# Patient Record
Sex: Female | Born: 1950 | Race: White
Health system: Southern US, Community
[De-identification: ages and names within clinical notes are randomized; demographics above are authoritative.]

## PROBLEM LIST (undated history)

## (undated) ENCOUNTER — Ambulatory Visit (INDEPENDENT_AMBULATORY_CARE_PROVIDER_SITE_OTHER): Admission: RE | Payer: Self-pay | Admitting: "Psychiatric/Mental Health

## (undated) DIAGNOSIS — I351 Nonrheumatic aortic (valve) insufficiency: Secondary | ICD-10-CM

## (undated) DIAGNOSIS — I10 Essential (primary) hypertension: Secondary | ICD-10-CM

## (undated) DIAGNOSIS — E785 Hyperlipidemia, unspecified: Secondary | ICD-10-CM

## (undated) DIAGNOSIS — F32A Depression, unspecified: Secondary | ICD-10-CM

## (undated) DIAGNOSIS — I253 Aneurysm of heart: Secondary | ICD-10-CM

## (undated) DIAGNOSIS — I071 Rheumatic tricuspid insufficiency: Secondary | ICD-10-CM

## (undated) DIAGNOSIS — M542 Cervicalgia: Secondary | ICD-10-CM

## (undated) DIAGNOSIS — F419 Anxiety disorder, unspecified: Secondary | ICD-10-CM

## (undated) DIAGNOSIS — S92909A Unspecified fracture of unspecified foot, initial encounter for closed fracture: Secondary | ICD-10-CM

## (undated) DIAGNOSIS — F40240 Claustrophobia: Secondary | ICD-10-CM

## (undated) DIAGNOSIS — Z789 Other specified health status: Secondary | ICD-10-CM

## (undated) DIAGNOSIS — D649 Anemia, unspecified: Secondary | ICD-10-CM

## (undated) DIAGNOSIS — G4730 Sleep apnea, unspecified: Secondary | ICD-10-CM

## (undated) DIAGNOSIS — K5900 Constipation, unspecified: Secondary | ICD-10-CM

## (undated) DIAGNOSIS — G43909 Migraine, unspecified, not intractable, without status migrainosus: Secondary | ICD-10-CM

## (undated) DIAGNOSIS — M545 Low back pain, unspecified: Secondary | ICD-10-CM

## (undated) DIAGNOSIS — M810 Age-related osteoporosis without current pathological fracture: Secondary | ICD-10-CM

## (undated) DIAGNOSIS — M818 Other osteoporosis without current pathological fracture: Secondary | ICD-10-CM

## (undated) DIAGNOSIS — R609 Edema, unspecified: Secondary | ICD-10-CM

## (undated) DIAGNOSIS — Z9289 Personal history of other medical treatment: Secondary | ICD-10-CM

## (undated) DIAGNOSIS — R002 Palpitations: Secondary | ICD-10-CM

## (undated) DIAGNOSIS — E039 Hypothyroidism, unspecified: Secondary | ICD-10-CM

## (undated) HISTORY — DX: Anxiety disorder, unspecified: F41.9

## (undated) HISTORY — DX: Rheumatic tricuspid insufficiency: I07.1

## (undated) HISTORY — DX: Constipation, unspecified: K59.00

## (undated) HISTORY — DX: Claustrophobia: F40.240

## (undated) HISTORY — DX: Depression, unspecified: F32.A

## (undated) HISTORY — DX: Other specified health status: Z78.9

## (undated) HISTORY — PX: WISDOM TOOTH EXTRACTION: SHX21

## (undated) HISTORY — DX: Unspecified fracture of unspecified foot, initial encounter for closed fracture: S92.909A

## (undated) HISTORY — DX: Essential (primary) hypertension: I10

## (undated) HISTORY — DX: Hyperlipidemia, unspecified: E78.5

## (undated) HISTORY — DX: Palpitations: R00.2

## (undated) HISTORY — DX: Edema, unspecified: R60.9

## (undated) HISTORY — DX: Migraine, unspecified, not intractable, without status migrainosus: G43.909

## (undated) HISTORY — DX: Anemia, unspecified: D64.9

## (undated) HISTORY — DX: Cervicalgia: M54.2

## (undated) HISTORY — DX: Low back pain, unspecified: M54.50

## (undated) HISTORY — DX: Personal history of other medical treatment: Z92.89

## (undated) HISTORY — PX: NECK SURGERY: SHX720

## (undated) HISTORY — PX: TONSILLECTOMY: SUR1361

## (undated) HISTORY — DX: Nonrheumatic aortic (valve) insufficiency: I35.1

## (undated) HISTORY — DX: Age-related osteoporosis without current pathological fracture: M81.0

## (undated) HISTORY — DX: Other osteoporosis without current pathological fracture: M81.8

## (undated) HISTORY — DX: Sleep apnea, unspecified: G47.30

## (undated) HISTORY — DX: Hypothyroidism, unspecified: E03.9

## (undated) HISTORY — DX: Aneurysm of heart: I25.3

---

## 1978-12-09 HISTORY — PX: D&C WITH HYSTEROSCOPY: SHX510231

## 1984-12-09 DIAGNOSIS — Z5189 Encounter for other specified aftercare: Secondary | ICD-10-CM

## 1984-12-09 HISTORY — DX: Encounter for other specified aftercare: Z51.89

## 1988-12-09 HISTORY — PX: HYSTERECTOMY: SHX81

## 2003-12-10 DIAGNOSIS — C4490 Unspecified malignant neoplasm of skin, unspecified: Secondary | ICD-10-CM

## 2003-12-10 HISTORY — PX: MOHS SURGERY: SUR867

## 2003-12-10 HISTORY — DX: Unspecified malignant neoplasm of skin, unspecified: C44.90

## 2006-07-14 ENCOUNTER — Ambulatory Visit: Admission: RE | Admit: 2006-07-14 | Payer: Self-pay | Source: Ambulatory Visit | Admitting: Gastroenterology

## 2006-10-22 ENCOUNTER — Emergency Department: Admit: 2006-10-22 | Payer: Self-pay | Source: Emergency Department | Admitting: Emergency Medical Services

## 2006-10-23 LAB — CBC WITH AUTO DIFFERENTIAL CERNER
Basophils Absolute: 0 /mm3 (ref 0.0–0.2)
Basophils: 1 % (ref 0–2)
Eosinophils Absolute: 0.3 /mm3 (ref 0.0–0.7)
Eosinophils: 6 % — ABNORMAL HIGH (ref 0–5)
Granulocytes Absolute: 2.5 /mm3 (ref 1.8–8.1)
Hematocrit: 35.2 % — ABNORMAL LOW (ref 37.0–47.0)
Hgb: 11.9 G/DL — ABNORMAL LOW (ref 12.0–16.0)
Lymphocytes Absolute: 1.8 /mm3 (ref 0.5–4.4)
Lymphocytes: 36 % (ref 15–41)
MCH: 30.3 PG (ref 28.0–32.0)
MCHC: 33.9 G/DL (ref 32.0–36.0)
MCV: 89.4 FL (ref 80.0–100.0)
MPV: 7.1 FL — ABNORMAL LOW (ref 7.4–10.4)
Monocytes Absolute: 0.4 /mm3 (ref 0.0–1.2)
Monocytes: 8 % (ref 0–11)
Neutrophils %: 50 % — ABNORMAL LOW (ref 52–75)
Platelets: 289 /mm3 (ref 140–400)
RBC: 3.94 /mm3 — ABNORMAL LOW (ref 4.20–5.40)
RDW: 12.3 % (ref 11.5–15.0)
WBC: 4.9 /mm3 (ref 3.5–10.8)

## 2006-10-23 LAB — BASIC METABOLIC PANEL
BUN: 18 MG/DL (ref 7–21)
CO2: 31 MEQ/L (ref 22–31)
Calcium: 11.5 MG/DL — ABNORMAL HIGH (ref 8.6–10.2)
Chloride: 101 MEQ/L (ref 98–107)
Creatinine: 0.9 MG/DL (ref 0.5–1.4)
Glucose: 90 MG/DL (ref 65–110)
Potassium: 3.8 MEQ/L (ref 3.6–5.0)
Sodium: 141 MEQ/L (ref 136–143)

## 2011-09-25 LAB — ECG 12-LEAD
Atrial Rate: 72 {beats}/min
P Axis: 73 degrees
P-R Interval: 154 ms
Q-T Interval: 394 ms
QRS Duration: 100 ms
QTC Calculation (Bezet): 431 ms
R Axis: 75 degrees
T Axis: 55 degrees
Ventricular Rate: 72 {beats}/min

## 2011-11-26 NOTE — Op Note (Signed)
MRN: [id number] DOCUMENT ID: [id number]      INTRODUCTION:      60 YEAR OLD FEMALE PATIENT PRESENTS FOR AN ELECTIVE OUTPATIENT      COLONOSCOPY.  THE INDICATION FOR THE PROCEDURE WAS AVERAGE RISK SCREENING      FOR COLONIC POLYPS.      CLINICAL HISTORY  PHYSICAL EXAMINATION:      THE PATIENT'S CLINICAL HISTORY AND PHYSICAL EXAMINATION WERE PERFORMED AND      ARE DOCUMENTED IN THE PATIENT'S RECORD.      CONSENT:      THE BENEFITS, RISKS, AND ALTERNATIVES TO THE PROCEDURE WERE DISCUSSED AND      INFORMED CONSENT WAS OBTAINED FROM THE PATIENT.      PREPARATION:      EKG, PULSE, PULSE OXIMETRY AND BLOOD PRESSURE MONITORED.      MEDICATIONS:      - MAC ANESTHESIA WAS ADMINISTERED      PROCEDURE:      RECTAL EXAM: NORMAL.      THE ENDOSCOPE WAS PASSED WITHOUT DIFFICULTY UNDER DIRECT VISUALIZATION TO      THE CECUM CONFIRMED BY LANDMARKS AND PHOTOGRAPH NUMBER 1 AND 2.      RETROFLEXION WAS PERFORMED IN THE RECTUM.  THE QUALITY OF THE PREPARATION      WAS GOOD.      FINDINGS:  THE COLONIC MUCOSA APPEARED ENTIRELY NORMAL.  THERE WERE NO      MASSES OR POLYPS FOUND.  THERE WERE NO VASCULAR ABNORMALITIES NOTED.      COMPLICATIONS:      THERE WERE NO COMPLICATIONS ASSOCIATED WITH THE PROCEDURE.      IMPRESSION:      1.  NORMAL COLONOSCOPIC EXAMINATION [45.23*].      RECOMMENDATION:      - REPEAT COLONOSCOPY IN 10 YEARS.      SIGNING PHYSICIAN: Jeanene Mena

## 2012-06-12 ENCOUNTER — Other Ambulatory Visit: Payer: Self-pay | Admitting: Family Medicine

## 2012-06-12 DIAGNOSIS — R51 Headache: Secondary | ICD-10-CM

## 2012-06-14 ENCOUNTER — Ambulatory Visit: Payer: Exclusive Provider Organization

## 2012-06-16 ENCOUNTER — Ambulatory Visit: Payer: Exclusive Provider Organization

## 2013-06-08 DIAGNOSIS — N181 Chronic kidney disease, stage 1: Secondary | ICD-10-CM

## 2013-06-08 HISTORY — DX: Chronic kidney disease, stage 1: N18.1

## 2013-07-14 ENCOUNTER — Ambulatory Visit (INDEPENDENT_AMBULATORY_CARE_PROVIDER_SITE_OTHER): Payer: Exclusive Provider Organization | Admitting: Internal Medicine

## 2013-07-14 ENCOUNTER — Encounter (INDEPENDENT_AMBULATORY_CARE_PROVIDER_SITE_OTHER): Payer: Self-pay

## 2013-07-14 ENCOUNTER — Encounter (INDEPENDENT_AMBULATORY_CARE_PROVIDER_SITE_OTHER): Payer: Self-pay | Admitting: Internal Medicine

## 2013-07-14 VITALS — BP 138/79 | HR 85 | Temp 97.9°F | Resp 12 | Ht 63.0 in | Wt 117.0 lb

## 2013-07-14 NOTE — Progress Notes (Addendum)
Subjective:       Patient ID: Katherine Mays is a 62 y.o. female.  Chief Complaint   Patient presents with   . Osteoporosis   . Chronic Kidney Disease   . Depression   . Anemia     c  Anemia  Presents for initial visit. Symptoms include abdominal pain, light-headedness and malaise/fatigue. There has been no fever or weight loss. Signs of blood loss that are not present include hematemesis, hematochezia, melena and vaginal bleeding. Past treatments include oral iron supplements (started recently.). There is no history of chronic liver disease.   Depression: Patient complains of depression. She complains of depressed mood, fatigue, hopelessness and insomnia. Onset was approximately several years ago, stable since that time.  She denies current suicidal and homicidal plan or intent.   Family history significant for no psychiatric illness.Possible organic causes contributing are: none.  Risk factors: none Previous treatment includes BuSpar, Effexor and seoquel, ambien and individual therapy. She complains of the following side effects from the treatment: none.  Osteoporosis: she has OP for several years. She had hysterectomy at young age. Mild bone pain.   CKD: she was noted to have borderline elevated creatinine for 3 months. She needs evaluation.     Past Medical History   Diagnosis Date   . Anemia    . Osteoporosis, idiopathic    . Depression age 26      followed by Psychiatrist.    . Disorder of thyroid      followed by Endocrinologist.        History     Social History   . Marital Status: Single     Spouse Name: N/A     Number of Children: N/A   . Years of Education: N/A     Occupational History   . Not on file.     Social History Main Topics   . Smoking status: Never Smoker    . Smokeless tobacco: Never Used   . Alcohol Use: Yes      Comment: 5-6 drinks per week   . Drug Use: No   . Sexually Active:      Other Topics Concern   . Not on file     Social History Narrative   . No narrative on file       Current  Outpatient Prescriptions   Medication Sig Dispense Refill   . aspirin 325 MG tablet Take 325 mg by mouth daily.       . busPIRone (BUSPAR) 30 MG tablet Take 30 mg by mouth.       . Calcium Carbonate-Vitamin D (CALCIUM + D PO) Take by mouth.       . Cholecalciferol (VITAMIN D3 PO) Take by mouth.       . Cyanocobalamin (VITAMIN B-12 PO) Take by mouth.       . HydrOXYzine HCl (ATARAX PO) Take by mouth.       . lactulose (CHRONULAC) 10 GM/15ML solution Take 20 g by mouth.       . LamoTRIgine 200 MG Tablet SR 24 hr Take by mouth.       . levothyroxine (SYNTHROID) 75 MCG tablet Take 75 mcg by mouth.       . liothyronine (CYTOMEL) 5 MCG tablet Take 5 mcg by mouth.       . prazosin (MINIPRESS) 1 MG capsule Take 1 mg by mouth.       . QUEtiapine (SEROQUEL) 50 MG tablet Take 50 mg by mouth  3 (three) times daily.       . QUEtiapine Fumarate (SEROQUEL XR) 150 MG Tablet SR 24 hr Take 150 mg by mouth nightly.       . venlafaxine (EFFEXOR-XR) 150 MG 24 hr capsule Take 150 mg by mouth.       . zolpidem (AMBIEN CR) 6.25 MG CR tablet Take 6.25 mg by mouth.           Allergies   Allergen Reactions   . Keflex (Cephalexin)        Past Surgical History   Procedure Date   . Tonsillectomy    . Cesarean section      [age]   . Neck surgery    . Wisdom tooth extraction    . Hysterectomy 1995       Family History   Problem Relation Age of Onset   . Thyroid disease Mother    . Diabetes Father    . Heart disease Father 27     M I    . Osteoporosis Father    . Stroke Father            Review of Systems   Constitutional: Positive for malaise/fatigue and fatigue. Negative for fever, chills, weight loss and diaphoresis.   Gastrointestinal: Positive for abdominal pain. Negative for melena, hematochezia and hematemesis.   Genitourinary: Negative for vaginal bleeding.   Musculoskeletal: Positive for myalgias, back pain and arthralgias.   Skin: Negative.    Neurological: Positive for light-headedness.   Psychiatric/Behavioral: Positive for sleep  disturbance and decreased concentration. Negative for behavioral problems and agitation. The patient is nervous/anxious.    All other systems reviewed and are negative.            Objective:    Physical Exam   Vitals reviewed.  Constitutional: She is oriented to person, place, and time. She appears well-developed and well-nourished.   HENT:   Right Ear: External ear normal.   Left Ear: External ear normal.   Nose: Nose normal.   Mouth/Throat: Oropharynx is clear and moist.   Eyes: Conjunctivae normal are normal. Pupils are equal, round, and reactive to light.   Neck: Normal range of motion. Neck supple. No JVD present. No tracheal deviation present. No thyromegaly present.   Cardiovascular: Normal rate, regular rhythm, normal heart sounds and intact distal pulses.    Pulmonary/Chest: Effort normal and breath sounds normal. She has no decreased breath sounds. She has no wheezes. She has no rhonchi. She has no rales.   Abdominal: Soft. Bowel sounds are normal. There is no hepatosplenomegaly. There is tenderness in the left upper quadrant. There is no rebound and no CVA tenderness. No hernia.   Lymphadenopathy:     She has no cervical adenopathy.   Neurological: She is alert and oriented to person, place, and time. She has normal reflexes. No cranial nerve deficit. Coordination normal.   Skin: Skin is warm and dry. No rash noted. She is not diaphoretic. No erythema.   Psychiatric: Her speech is normal and behavior is normal. Judgment and thought content normal. Her affect is not angry. Cognition and memory are normal. She exhibits a depressed mood.     Filed Vitals:    07/14/13 1503   BP: 138/79   Pulse: 85   Temp: 97.9 F (36.6 C)   TempSrc: Tympanic   Resp: 12   Height: 1.6 m (5\' 3" )   Weight: 53.071 kg (117 lb)   SpO2: 98%  Assessment:       1. Iron deficiency anemia, unspecified  CBC and differential   2. CKD (chronic kidney disease), unspecified stage  Renal function panel    Urinalysis    Ultrasound  renal kidney   3. Senile osteoporosis     4. Depression             Plan:       1. She was advised to take OTC Iron pill 325 mg daily. CBC is ordered.   2. She was ordered to have U/s kidneys and renal panel. She will reduce Aspirin to 81 mg daily. Avoid other NSAIDS.  3. She will take Calcium and Vit D. Exercise more.   4. She sees Psychiatrist for depression treatment. Currently well controled on multiple medicines.   5. Seeing Endo for thyroid problems.   6. Risk & Benefits of the new medication(s) were explained to the patient (and family) who verbalized understanding & agreed to the treatment plan. Patient (family) encouraged to contact me/clinical staff with any questions/concerns

## 2013-07-15 LAB — URINALYSIS
Bilirubin, UA: NEGATIVE
Blood, UA: NEGATIVE
Glucose Qualitative: NEGATIVE
Ketones UA: NEGATIVE
Leukocyte Esterase, UA: NEGATIVE
NITRITE: NEGATIVE
Protein, UA: NEGATIVE
Specific Gravity, UA: 1.007 (ref 1.001–1.035)
pH: 7 (ref 5.0–8.0)

## 2013-07-15 LAB — CBC AND DIFFERENTIAL
Atypical Lymphocytes %: 0 %
Baso(Absolute): 32 cells/uL (ref 0–200)
Basophils: 0.6 %
Eosinophils Absolute: 173 cells/uL (ref 15–500)
Eosinophils: 3.2 %
Hematocrit: 34 % — ABNORMAL LOW (ref 35.0–45.0)
Hemoglobin: 11.2 g/dL — ABNORMAL LOW (ref 11.7–15.5)
Lymphocytes Absolute: 1199 cells/uL (ref 850–3900)
Lymphocytes: 22.2 %
MCH: 28.4 pg (ref 27–33)
MCHC: 33 g/dL (ref 32–36)
MCV: 86 fL (ref 80–100)
MPV: 7.2 fL — ABNORMAL LOW (ref 7.5–11.5)
Monocytes Absolute: 448 cells/uL (ref 200–950)
Monocytes: 8.3 %
Neutrophils Absolute: 3548 cells/uL (ref 1500–7800)
Neutrophils: 65.7 %
Platelets: 306 10*3/uL (ref 140–400)
RBC: 3.96 10*6/uL (ref 3.80–5.10)
RDW: 19.3 % — ABNORMAL HIGH (ref 11.0–15.0)
WBC: 5.4 10*3/uL (ref 3.8–10.8)

## 2013-07-15 LAB — RENAL FUNCTION PANEL
Albumin: 4.3 G/DL (ref 3.6–5.1)
BUN / Creatinine Ratio: 17.4 (ref 6–22)
BUN: 21 MG/DL (ref 7–25)
CO2: 30 mmol/L (ref 19–30)
Calcium: 9.5 MG/DL (ref 8.6–10.4)
Chloride: 101 mmol/L (ref 98–110)
Creatinine: 1.21 mg/dL — ABNORMAL HIGH (ref 0.50–0.99)
EGFR African American: 55 mL/min/{1.73_m2} — AB (ref >=60)
EGFR: 48 mL/min/{1.73_m2} — AB (ref >=60)
Glucose: 89 MG/DL (ref 65–99)
Phosphorus: 5.3 MG/DL — ABNORMAL HIGH (ref 2.5–4.5)
Potassium: 4.1 mmol/L (ref 3.5–5.3)
Sodium: 139 mmol/L (ref 135–146)

## 2013-07-16 ENCOUNTER — Ambulatory Visit
Admission: RE | Admit: 2013-07-16 | Discharge: 2013-07-16 | Disposition: A | Discharge status: Home / Self Care | Payer: Exclusive Provider Organization | Source: Ambulatory Visit | Attending: Internal Medicine | Admitting: Internal Medicine

## 2013-07-16 DIAGNOSIS — N189 Chronic kidney disease, unspecified: Secondary | ICD-10-CM | POA: Insufficient documentation

## 2013-07-19 ENCOUNTER — Telehealth (INDEPENDENT_AMBULATORY_CARE_PROVIDER_SITE_OTHER): Payer: Self-pay | Admitting: Internal Medicine

## 2013-07-19 ENCOUNTER — Encounter (INDEPENDENT_AMBULATORY_CARE_PROVIDER_SITE_OTHER): Payer: Self-pay | Admitting: Internal Medicine

## 2013-07-19 ENCOUNTER — Ambulatory Visit (INDEPENDENT_AMBULATORY_CARE_PROVIDER_SITE_OTHER): Payer: Exclusive Provider Organization | Admitting: Internal Medicine

## 2013-07-19 VITALS — BP 139/80 | HR 88 | Temp 97.1°F | Resp 12 | Ht 63.0 in | Wt 117.0 lb

## 2013-07-19 NOTE — Telephone Encounter (Signed)
Pt was told by radiology that her kidneys - fair [facility] hospital radiology dept   Pt called to get all her results from [place] = labs and radiology   Pt currently has an

## 2013-07-19 NOTE — Progress Notes (Signed)
Subjective:       Patient ID: Katherine Mays is a 62 y.o. female.  Chief Complaint   Patient presents with   . Anemia   . Chronic Kidney Disease       Anemia  Presents for initial visit. Symptoms include malaise/fatigue and paresthesias. There has been no fever or weight loss. Signs of blood loss that are not present include hematemesis, hematochezia, melena and vaginal bleeding. Past treatments include nothing. Past medical history includes chronic renal disease.   CKD stage I: no symptoms. Was on Aspirin 325 mg daily.     Chemistry        Component Value Date/Time    NA 139 07/14/2013 1558    K 4.1 07/14/2013 1558    CL 101 07/14/2013 1558    CL 101 10/23/2006 0130    CO2 30 07/14/2013 1558    BUN 21 07/14/2013 1558    GLU 89 07/14/2013 1558    No results found for this basename: CALCIUM, ALKPHOS, AST, ALT, BILITOT            The following portions of the patient's history were reviewed and updated as appropriate: allergies, current medications, past family history, past medical history, past social history, past surgical history and problem list.    Review of Systems   Constitutional: Positive for malaise/fatigue and fatigue. Negative for fever, weight loss and unexpected weight change.   Gastrointestinal: Negative for melena, hematochezia and hematemesis.   Genitourinary: Negative for urgency, decreased urine volume, vaginal bleeding and difficulty urinating.   Neurological: Positive for paresthesias.           Objective:    Physical Exam   Vitals reviewed.  Constitutional: She appears well-developed and well-nourished.   Cardiovascular: Normal rate, regular rhythm and normal heart sounds.    Pulmonary/Chest: Effort normal and breath sounds normal. No respiratory distress. She has no wheezes.   Abdominal: Soft. Bowel sounds are normal. She exhibits no distension and no mass. There is no tenderness.   Musculoskeletal: Normal range of motion. She exhibits no edema and no tenderness.   Skin: Skin is warm and dry. No rash noted.  No erythema.     Filed Vitals:    07/19/13 1410   BP: 139/80   Pulse: 88   Temp: 97.1 F (36.2 C)   TempSrc: Tympanic   Resp: 12   Height: 1.6 m (5\' 3" )   Weight: 53.071 kg (117 lb)   SpO2: 98%             Assessment:       1. CKD (chronic kidney disease), stage I  Ambulatory referral to Nephrology   2. Anemia  Ambulatory referral to Nephrology    CBC and differential    IRON PROFILE    Ferritin    Vitamin B12    Folate           Plan:       1. Referred to Nephrologist. She will avoid NSAIDS. She will ask Psychiatrist to taper off Minipress.   2. Anemia work up is ordered. Thyroid tests in 07/14 were normal.   3. Follow up in 3 months.

## 2013-07-20 LAB — CBC AND DIFFERENTIAL
Atypical Lymphocytes %: 0 %
Baso(Absolute): 31 cells/uL (ref 0–200)
Basophils: 0.5 %
Eosinophils Absolute: 171 cells/uL (ref 15–500)
Eosinophils: 2.8 %
Hematocrit: 36.4 % (ref 35.0–45.0)
Hemoglobin: 11.9 g/dL (ref 11.7–15.5)
Lymphocytes Absolute: 1458 cells/uL (ref 850–3900)
Lymphocytes: 23.9 %
MCH: 28.3 pg (ref 27–33)
MCHC: 32.7 g/dL (ref 32–36)
MCV: 87 fL (ref 80–100)
MPV: 7.7 fL (ref 7.5–11.5)
Monocytes Absolute: 616 cells/uL (ref 200–950)
Monocytes: 10.1 %
Neutrophils Absolute: 3825 cells/uL (ref 1500–7800)
Neutrophils: 62.7 %
Platelets: 284 10*3/uL (ref 140–400)
RBC: 4.21 10*6/uL (ref 3.80–5.10)
RDW: 18.6 % — ABNORMAL HIGH (ref 11.0–15.0)
WBC: 6.1 10*3/uL (ref 3.8–10.8)

## 2013-07-20 LAB — IRON PROFILE
Iron Saturation: 11 % — ABNORMAL LOW (ref 15–50)
Iron: 42 ug/dL (ref 40–160)
TIBC: 397 ug/dl (ref 250–450)

## 2013-07-20 LAB — VITAMIN B12: Vitamin B-12: 463 pg/mL (ref 200–1100)

## 2013-07-20 LAB — FERRITIN: Ferritin: 20 ng/mL (ref 20–288)

## 2013-07-20 LAB — FOLATE: Folate: >24 ng/mL (ref >5.4)

## 2013-08-16 ENCOUNTER — Ambulatory Visit (INDEPENDENT_AMBULATORY_CARE_PROVIDER_SITE_OTHER): Payer: Exclusive Provider Organization | Admitting: Internal Medicine

## 2013-08-17 ENCOUNTER — Ambulatory Visit (INDEPENDENT_AMBULATORY_CARE_PROVIDER_SITE_OTHER): Payer: Exclusive Provider Organization | Admitting: Internal Medicine

## 2013-11-19 ENCOUNTER — Encounter (INDEPENDENT_AMBULATORY_CARE_PROVIDER_SITE_OTHER): Payer: Self-pay | Admitting: Internal Medicine

## 2013-11-19 ENCOUNTER — Ambulatory Visit (INDEPENDENT_AMBULATORY_CARE_PROVIDER_SITE_OTHER): Payer: Commercial Managed Care - POS | Admitting: Internal Medicine

## 2013-11-19 VITALS — BP 132/82 | HR 86 | Temp 97.5°F | Resp 12 | Ht 63.0 in | Wt 117.0 lb

## 2013-11-19 DIAGNOSIS — E039 Hypothyroidism, unspecified: Secondary | ICD-10-CM

## 2013-11-19 DIAGNOSIS — I10 Essential (primary) hypertension: Secondary | ICD-10-CM

## 2013-11-19 DIAGNOSIS — N189 Chronic kidney disease, unspecified: Secondary | ICD-10-CM

## 2013-11-19 MED ORDER — DILTIAZEM HCL ER COATED BEADS 120 MG PO CP24
120.0000 mg | ORAL_CAPSULE | Freq: Every day | ORAL | Status: DC
Start: 2013-11-19 — End: 2013-12-10

## 2013-11-19 NOTE — Patient Instructions (Signed)
Patient Education   Diltiazem Hydrochloride Oral capsule, extended-release    Diltiazem Hydrochloride Oral tablet    Diltiazem Hydrochloride Oral tablet, extended-release    Diltiazem Hydrochloride Solution for injection    Diltiazem Maleate Oral tablet, extended-release   Diltiazem Hydrochloride Oral tablet  What is this medicine?  DILTIAZEM (dil TYE a zem) is a calcium-channel blocker. It affects the amount of calcium found in your heart and muscle cells. This relaxes your blood vessels, which can reduce the amount of work the heart has to do. This medicine is used to treat chest pain caused by angina.  This medicine may be used for other purposes; ask your health care provider or pharmacist if you have questions.  What should I tell my health care provider before I take this medicine?  They need to know if you have any of these conditions:   heart problems, low blood pressure, irregular heartbeat   liver disease   previous heart attack   an unusual or allergic reaction to diltiazem, other medicines, foods, dyes, or preservatives   pregnant or trying to get pregnant   breast-feeding  How should I use this medicine?  Take this medicine by mouth with a glass of water. Follow the directions on the prescription label. This medicine is usually taken before meals and at bedtime. Take your doses at regular intervals. Do not take your medicine more often then directed. Do not stop taking except on the advice of your doctor or health care professional.  Talk to your pediatrician regarding the use of this medicine in children. Special care may be needed.  Overdosage: If you think you have taken too much of this medicine contact a poison control center or emergency room at once.  NOTE: This medicine is only for you. Do not share this medicine with others.  What if I miss a dose?  If you miss a dose, take it as soon as you can. If it is almost time for your next dose, take only that dose. Do not take double or  extra doses.  What may interact with this medicine?  Do not take this medicine with any of the following:   cisapride   hawthorn   pimozide   ranolazine   red yeast rice  This medicine may also interact with the following medications:   buspirone   carbamazepine   cimetidine   cyclosporine   digoxin   local anesthetics or general anesthetics   lovastatin   medicines for anxiety or difficulty sleeping like midazolam and triazolam   medicines for high blood pressure or heart problems   quinidine   rifampin, rifabutin, or rifapentine  This list may not describe all possible interactions. Give your health care provider a list of all the medicines, herbs, non-prescription drugs, or dietary supplements you use. Also tell them if you smoke, drink alcohol, or use illegal drugs. Some items may interact with your medicine.  What should I watch for while using this medicine?  Check your blood pressure and pulse rate regularly. Ask your doctor or health care professional what your blood pressure and pulse rate should be and when you should contact him or her.  You may feel dizzy or lightheaded. Do not drive, use machinery, or do anything that needs mental alertness until you know how this medicine affects you. To reduce the risk of dizzy or fainting spells, do not sit or stand up quickly, especially if you are an older patient. Alcohol can make you more   dizzy or increase flushing and rapid heartbeats. Avoid alcoholic drinks.  What side effects may I notice from receiving this medicine?  Side effects that you should report to your doctor or health care professional as soon as possible:   allergic reactions like skin rash, itching or hives, swelling of the face, lips, or tongue   confusion, mental depression   feeling faint or lightheaded, falls   pinpoint red spots on the skin   redness, blistering, peeling or loosening of the skin, including inside the mouth   slow, irregular heartbeat   swelling of the  ankles, feet   unusual bleeding or bruising  Side effects that usually do not require medical attention (report to your doctor or health care professional if they continue or are bothersome):   change in sex drive or performance   constipation or diarrhea   flushing of the face   headache   nausea, vomiting   tired or weak   trouble sleeping  This list may not describe all possible side effects. Call your doctor for medical advice about side effects. You may report side effects to FDA at 1-800-FDA-1088.  Where should I keep my medicine?  Keep out of the reach of children.  Store at room temperature between 20 and 25 degrees C (68 and 77 degrees F). Protect from light. Keep container tightly closed. Throw away any unused medicine after the expiration date.  NOTE:This sheet is a summary. It may not cover all possible information. If you have questions about this medicine, talk to your doctor, pharmacist, or health care provider. Copyright 2014 [employer]

## 2013-11-19 NOTE — Progress Notes (Signed)
Subjective:       Patient ID: Katherine Mays is a 62 y.o. female.  Chief Complaint   Patient presents with   . Hypertension   . Chronic Kidney Disease   . Hypothyroidism       Hypertension  This is a new problem. The current episode started 1 to 4 weeks ago. The problem has been waxing and waning since onset. The problem is controlled. Associated symptoms include anxiety. Pertinent negatives include no chest pain, headaches, palpitations, PND or shortness of breath. There are no associated agents to hypertension. Risk factors for coronary artery disease include post-menopausal state. Past treatments include nothing. There is no history of CAD/MI or CVA. Identifiable causes of hypertension include chronic renal disease.   Hypothyroidism  Presents for follow-up visit. Symptoms include anxiety, cold intolerance and fatigue. Patient reports no palpitations, tremors, visual change, weight gain or weight loss. The symptoms have been stable.   CKD: she saw nephrologist and had several blood tests. I reviewed results. Last Creatinine is 1.07. She has stopped Minipress. She monitored her BP which has gradually increased to SBP 140-150 and DBP 85.   Current Outpatient Prescriptions   Medication Sig Dispense Refill   . aspirin EC 81 MG EC tablet Take 81 mg by mouth daily.       . iron-vitamin C (VITRON-C) 65-125 MG Tab Take by mouth.       . busPIRone (BUSPAR) 30 MG tablet Take 30 mg by mouth.       . Calcium Carbonate-Vitamin D (CALCIUM + D PO) Take by mouth.       . Cholecalciferol (VITAMIN D3 PO) Take by mouth.       . Cyanocobalamin (VITAMIN B-12 PO) Take by mouth.       . diltiazem (CARDIZEM CD) 120 MG 24 hr capsule Take 1 capsule (120 mg total) by mouth daily.  30 capsule  0   . HydrOXYzine HCl (ATARAX PO) Take by mouth.       . lactulose (CHRONULAC) 10 GM/15ML solution Take 20 g by mouth.       . Lactulose (DUPHALAC) 10 GM/15ML Solution        . LamoTRIgine 200 MG Tablet SR 24 hr Take by mouth.       . levothyroxine  (SYNTHROID) 75 MCG tablet Take 75 mcg by mouth.       . liothyronine (CYTOMEL) 5 MCG tablet Take 5 mcg by mouth.       . prazosin (MINIPRESS) 1 MG capsule Take 1 mg by mouth.       . QUEtiapine (SEROQUEL) 50 MG tablet Take 50 mg by mouth 3 (three) times daily.       . QUEtiapine Fumarate (SEROQUEL XR) 150 MG Tablet SR 24 hr Take 150 mg by mouth nightly.       . valacyclovir (VALTREX) 1000 MG tablet        . venlafaxine (EFFEXOR-XR) 150 MG 24 hr capsule Take 150 mg by mouth.       . zolpidem (AMBIEN CR) 6.25 MG CR tablet Take 6.25 mg by mouth.       . [DISCONTINUED] aspirin 325 MG tablet Take 325 mg by mouth daily.             The following portions of the patient's history were reviewed and updated as appropriate: allergies, current medications, past family history, past medical history, past social history, past surgical history and problem list.    Review of Systems  Constitutional: Positive for fatigue. Negative for fever, weight loss, weight gain and unexpected weight change.   Respiratory: Negative for cough, shortness of breath and wheezing.    Cardiovascular: Negative for chest pain, palpitations and PND.   Gastrointestinal: Negative for abdominal pain.   Genitourinary: Negative for urgency, decreased urine volume and difficulty urinating.   Musculoskeletal: Negative for arthralgias.   Neurological: Negative for tremors and headaches.   Hematological: Positive for cold intolerance.           Objective:    Physical Exam   Vitals reviewed.  Constitutional: She is oriented to person, place, and time. She appears well-developed and well-nourished.   HENT:   Nose: Nose normal.   Mouth/Throat: Oropharynx is clear and moist.   Eyes: Conjunctivae normal and EOM are normal. Pupils are equal, round, and reactive to light.   Neck: Normal range of motion. Neck supple. No thyromegaly present.   Cardiovascular: Normal rate, regular rhythm, normal heart sounds and intact distal pulses.    Pulmonary/Chest: Effort normal and  breath sounds normal. No respiratory distress. She has no wheezes. She has no rales.   Abdominal: Soft. Bowel sounds are normal. She exhibits no distension. There is no tenderness. There is no rebound.   Musculoskeletal: Normal range of motion. She exhibits no edema and no tenderness.   Neurological: She is alert and oriented to person, place, and time. No cranial nerve deficit. Coordination normal.     Filed Vitals:    11/19/13 1513 11/19/13 1556   BP: 132/85 132/82   Pulse: 86    Temp: 97.5 F (36.4 C)    TempSrc: Oral    Resp: 12    Height: 1.6 m (5\' 3" )    Weight: 53.071 kg (117 lb)    SpO2: 98%            Assessment:       1. Essential hypertension, benign    2. Hypothyroidism    3. CKD (chronic kidney disease), unspecified stage            Plan:       1. Her BP is borderline high. She may benefit from aggressive BP management due to CKD. I started her on Cardizem CD 120 mg daily. Side effects are explained.The risks and complications of high blood pressure have been explained. Importance of low salt diet, and weight control have been explained.    2. She will continue Synthroid 75 mcg and Cytomel 5 mg daily.   3. She is followed by Nephrologist. She needs to keep well hydrated. Oral fluids are advised.she will have more labs in Jan, 2015. Stop aspirin and avoid NSAIDS.   4. Risk & Benefits of the new medication(s) were explained to the patient (and family) who verbalized understanding & agreed to the treatment plan. Patient (family) encouraged to contact me/clinical staff with any questions/concerns

## 2013-11-22 ENCOUNTER — Encounter (INDEPENDENT_AMBULATORY_CARE_PROVIDER_SITE_OTHER): Payer: Self-pay

## 2013-12-10 ENCOUNTER — Ambulatory Visit (INDEPENDENT_AMBULATORY_CARE_PROVIDER_SITE_OTHER): Payer: Commercial Managed Care - POS | Admitting: Internal Medicine

## 2013-12-10 ENCOUNTER — Encounter (INDEPENDENT_AMBULATORY_CARE_PROVIDER_SITE_OTHER): Payer: Self-pay | Admitting: Internal Medicine

## 2013-12-10 VITALS — BP 110/72 | HR 88 | Temp 98.0°F | Resp 12 | Ht 63.0 in | Wt 116.0 lb

## 2013-12-10 DIAGNOSIS — N189 Chronic kidney disease, unspecified: Secondary | ICD-10-CM

## 2013-12-10 DIAGNOSIS — I10 Essential (primary) hypertension: Secondary | ICD-10-CM | POA: Insufficient documentation

## 2013-12-10 MED ORDER — DILTIAZEM HCL ER COATED BEADS 120 MG PO CP24
120.0000 mg | ORAL_CAPSULE | Freq: Every day | ORAL | Status: DC
Start: 2013-12-10 — End: 2013-12-10

## 2013-12-10 MED ORDER — DILTIAZEM HCL ER COATED BEADS 120 MG PO CP24
120.0000 mg | ORAL_CAPSULE | Freq: Every day | ORAL | Status: DC
Start: 2013-12-10 — End: 2014-11-11

## 2013-12-10 NOTE — Progress Notes (Signed)
Subjective:       Patient ID: Katherine Mays is a 63 y.o. female.  Chief Complaint   Patient presents with   . Hypertension       HPI Comments: She has stopped Prazocin and now on Cardizem 120 mg daily. She is tolerating well.       Hypertension  This is a chronic problem. The current episode started more than 1 year ago. The problem is unchanged. The problem is controlled. Pertinent negatives include no chest pain, headaches, neck pain, palpitations, PND or shortness of breath. There are no associated agents to hypertension. Past treatments include calcium channel blockers. The current treatment provides significant improvement. There is no history of CAD/MI or CVA. Identifiable causes of hypertension include chronic renal disease.     Current Outpatient Prescriptions   Medication Sig Dispense Refill   . aspirin EC 81 MG EC tablet Take 81 mg by mouth daily.       . busPIRone (BUSPAR) 30 MG tablet Take 30 mg by mouth.       . Calcium Carbonate-Vitamin D (CALCIUM + D PO) Take by mouth.       . Cholecalciferol (VITAMIN D3 PO) Take by mouth.       . Cyanocobalamin (VITAMIN B-12 PO) Take by mouth.       . diltiazem (CARDIZEM CD) 120 MG 24 hr capsule Take 1 capsule (120 mg total) by mouth daily.  90 capsule  3   . HydrOXYzine HCl (ATARAX PO) Take by mouth.       . iron-vitamin C (VITRON-C) 65-125 MG Tab Take by mouth.       . lactulose (CHRONULAC) 10 GM/15ML solution Take 20 g by mouth.       . Lactulose (DUPHALAC) 10 GM/15ML Solution        . LamoTRIgine 200 MG Tablet SR 24 hr Take by mouth.       . levothyroxine (SYNTHROID) 75 MCG tablet Take 75 mcg by mouth.       . liothyronine (CYTOMEL) 5 MCG tablet Take 5 mcg by mouth.       . QUEtiapine (SEROQUEL) 50 MG tablet Take 50 mg by mouth 3 (three) times daily.       . QUEtiapine Fumarate (SEROQUEL XR) 150 MG Tablet SR 24 hr Take 150 mg by mouth nightly.       . valacyclovir (VALTREX) 1000 MG tablet        . venlafaxine (EFFEXOR-XR) 150 MG 24 hr capsule Take 150 mg by mouth.        . zolpidem (AMBIEN CR) 6.25 MG CR tablet Take 6.25 mg by mouth.       . [DISCONTINUED] diltiazem (CARDIZEM CD) 120 MG 24 hr capsule Take 1 capsule (120 mg total) by mouth daily.  30 capsule  0   . [DISCONTINUED] diltiazem (CARDIZEM CD) 120 MG 24 hr capsule Take 1 capsule (120 mg total) by mouth daily.  30 capsule  0   . [DISCONTINUED] prazosin (MINIPRESS) 1 MG capsule Take 1 mg by mouth.             The following portions of the patient's history were reviewed and updated as appropriate: allergies, current medications, past family history, past medical history, past social history, past surgical history and problem list.    Review of Systems   Constitutional: Negative for fever, fatigue and unexpected weight change.   Respiratory: Negative for shortness of breath and wheezing.    Cardiovascular: Negative for chest  pain, palpitations, leg swelling and PND.   Gastrointestinal: Negative for abdominal distention.   Musculoskeletal: Negative for arthralgias and neck pain.   Neurological: Negative for headaches.           Objective:    Physical Exam   Vitals reviewed.  Constitutional: She is oriented to person, place, and time. She appears well-developed and well-nourished.   Cardiovascular: Normal rate, regular rhythm, normal heart sounds and intact distal pulses.    Pulmonary/Chest: Effort normal and breath sounds normal. No respiratory distress. She has no wheezes.   Abdominal: Soft. Bowel sounds are normal. There is no tenderness.   Musculoskeletal: Normal range of motion. She exhibits no edema and no tenderness.   Neurological: She is alert and oriented to person, place, and time. She has normal reflexes. No cranial nerve deficit. Coordination normal.   Skin: She is not diaphoretic.     Filed Vitals:    12/10/13 1333 12/10/13 1344   BP: 123/83 110/72   Pulse: 95 88   Temp: 98 F (36.7 C)    TempSrc: Tympanic    Resp:  12   Height: 1.6 m (5\' 3" )    Weight: 52.617 kg (116 lb)    SpO2: 98%               Assessment:       1. Essential hypertension, benign  Renal function panel    diltiazem (CARDIZEM CD) 120 MG 24 hr capsule    DISCONTINUED: diltiazem (CARDIZEM CD) 120 MG 24 hr capsule   2. CKD (chronic kidney disease), unspecified stage  Renal function panel           Plan:       Renal panel is ordered. She will be seeing her Nephrologist on 12/31/13. We will fax this report.  She was given refill of Cardizem CD 120 mg daily. The risks and complications of high blood pressure have been explained. Importance of low salt diet, and weight control have been explained.    Follow up in 8 weeks.

## 2013-12-11 LAB — RENAL FUNCTION PANEL
Albumin: 4.6 G/DL (ref 3.6–5.1)
BUN / Creatinine Ratio: 16.5 (ref 6–22)
BUN: 17 MG/DL (ref 7–25)
CO2: 33 mmol/L — ABNORMAL HIGH (ref 19–30)
Calcium: 10.1 MG/DL (ref 8.6–10.4)
Chloride: 99 mmol/L (ref 98–110)
Creatinine: 1.03 mg/dL — ABNORMAL HIGH (ref 0.50–0.99)
EGFR African American: 67 mL/min/{1.73_m2} (ref >=60)
EGFR: 58 mL/min/{1.73_m2} — AB (ref >=60)
Glucose: 63 MG/DL — ABNORMAL LOW (ref 65–99)
Phosphorus: 5.3 MG/DL — ABNORMAL HIGH (ref 2.5–4.5)
Potassium: 4.3 mmol/L (ref 3.5–5.3)
Sodium: 140 mmol/L (ref 135–146)

## 2014-01-11 ENCOUNTER — Encounter (INDEPENDENT_AMBULATORY_CARE_PROVIDER_SITE_OTHER): Payer: Self-pay

## 2014-06-22 ENCOUNTER — Ambulatory Visit (INDEPENDENT_AMBULATORY_CARE_PROVIDER_SITE_OTHER): Payer: Commercial Managed Care - POS | Admitting: Internal Medicine

## 2014-06-22 ENCOUNTER — Encounter (INDEPENDENT_AMBULATORY_CARE_PROVIDER_SITE_OTHER): Payer: Self-pay | Admitting: Internal Medicine

## 2014-06-22 VITALS — BP 126/86 | HR 91 | Temp 97.5°F | Resp 12 | Ht 63.0 in | Wt 116.0 lb

## 2014-06-22 DIAGNOSIS — I10 Essential (primary) hypertension: Secondary | ICD-10-CM

## 2014-06-22 DIAGNOSIS — E039 Hypothyroidism, unspecified: Secondary | ICD-10-CM

## 2014-06-22 DIAGNOSIS — D509 Iron deficiency anemia, unspecified: Secondary | ICD-10-CM

## 2014-06-22 DIAGNOSIS — N189 Chronic kidney disease, unspecified: Secondary | ICD-10-CM

## 2014-06-22 NOTE — Progress Notes (Signed)
[facility] PRIMARY CARE  [place]  PROGRESS NOTE      Patient: Katherine Mays   Date: 06/22/2014   MRN: [id number]     ASSESSMENT/PLAN     Shantoya Geurts is a 63 y.o. female    Chief Complaint   Patient presents with   . Hypertension   . Chronic Kidney Disease   . Anemia   . Hypothyroidism        1. CKD (chronic kidney disease), unspecified stage  She is stable. Labs are ordered. Avoid NSAID.     - CBC and differential  - Comprehensive metabolic panel    2. Essential hypertension, benign  The risks and complications of high blood pressure have been explained. Importance of low salt diet, and weight control have been explained.      -continue Cardizem CD 120 mg daily.  - CBC and differential  - Comprehensive metabolic panel    3. Iron deficiency anemia, unspecified  Check:     - CBC and differential  - IRON PROFILE  - Ferritin    4. Hypothyroidism, unspecified hypothyroidism type  continue Synthroid 75 mcg daily.             MEDICATIONS     Current Outpatient Prescriptions   Medication Sig Dispense Refill   . aspirin EC 81 MG EC tablet Take 81 mg by mouth daily.     . busPIRone (BUSPAR) 30 MG tablet Take 30 mg by mouth.     . Calcium Carbonate-Vitamin D (CALCIUM + D PO) Take by mouth.     . Cholecalciferol (VITAMIN D3 PO) Take by mouth.     . Cyanocobalamin (VITAMIN B-12 PO) Take by mouth.     . diltiazem (CARDIZEM CD) 120 MG 24 hr capsule Take 1 capsule (120 mg total) by mouth daily. 90 capsule 3   . HydrOXYzine HCl (ATARAX PO) Take by mouth.     . iron-vitamin C (VITRON-C) 65-125 MG Tab Take by mouth.     . lactulose (CHRONULAC) 10 GM/15ML solution Take 20 g by mouth.     . Lactulose (DUPHALAC) 10 GM/15ML Solution      . LamoTRIgine 200 MG Tablet SR 24 hr Take by mouth.     . levothyroxine (SYNTHROID) 75 MCG tablet Take 75 mcg by mouth.     . liothyronine (CYTOMEL) 5 MCG tablet Take 5 mcg by mouth.     . QUEtiapine (SEROQUEL) 50 MG tablet Take 50 mg by mouth 3 (three) times daily.     . QUEtiapine Fumarate (SEROQUEL XR) 150  MG Tablet SR 24 hr Take 150 mg by mouth nightly.     . valacyclovir (VALTREX) 1000 MG tablet      . venlafaxine (EFFEXOR-XR) 150 MG 24 hr capsule Take 150 mg by mouth.     . zolpidem (AMBIEN CR) 6.25 MG CR tablet Take 6.25 mg by mouth.       No current facility-administered medications for this visit.       Allergies   Allergen Reactions   . Keflex [Cephalexin]        SUBJECTIVE     Chief Complaint   Patient presents with   . Hypertension   . Chronic Kidney Disease   . Anemia   . Hypothyroidism        Hypertension  This is a chronic problem. The problem is unchanged. The problem is controlled. Associated symptoms include malaise/fatigue. Pertinent negatives include no chest pain, headaches, neck pain, palpitations, peripheral edema, PND  or shortness of breath. There are no associated agents to hypertension. Past treatments include calcium channel blockers. The current treatment provides significant improvement. There are no compliance problems.  Identifiable causes of hypertension include chronic renal disease.   Anemia  Presents for follow-up visit. Symptoms include light-headedness and malaise/fatigue. There has been no fever, leg swelling or palpitations. Signs of blood loss that are not present include hematemesis, hematochezia and melena. Past treatments include oral iron supplements. Past medical history includes chronic renal disease. There are no compliance problems.    Hypothyroidism  Presents for follow-up visit. Symptoms include anxiety, dry skin and fatigue. Patient reports no palpitations or weight gain. Past treatments include levothyroxine. The treatment provided significant relief.       '  Chemistry        Component Value Date/Time    NA 140 12/10/2013 1354    K 4.3 12/10/2013 1354    CL 99 12/10/2013 1354    CL 101 10/23/2006 0130    CO2 33* 12/10/2013 1354    BUN 17 12/10/2013 1354    CREAT 1.03* 12/10/2013 1354    CREAT 0.9 10/23/2006 0130    GLU 63* 12/10/2013 1354        Component Value  Date/Time    CA 10.1 12/10/2013 1354            ROS     Review of Systems   Constitutional: Positive for malaise/fatigue and fatigue. Negative for fever, weight gain and unexpected weight change.   Respiratory: Negative for cough, shortness of breath, wheezing and stridor.    Cardiovascular: Negative for chest pain, palpitations and PND.   Gastrointestinal: Negative for melena, hematochezia and hematemesis.   Musculoskeletal: Positive for myalgias, back pain and arthralgias. Negative for neck pain.   Neurological: Positive for weakness and light-headedness. Negative for dizziness, seizures, syncope and headaches.   Psychiatric/Behavioral: Positive for sleep disturbance. The patient is nervous/anxious.    All other systems reviewed and are negative.          The following portions of the patient's history were reviewed and updated as appropriate: Allergies, Current Medications, Past Family History, Past Medical history, Past social history, Past surgical history, and Problem List.      PHYSICAL EXAM     Filed Vitals:    06/22/14 1514 06/22/14 1525   BP: 138/90 126/86   Pulse: 91    Temp: 97.5 F (36.4 C)    TempSrc: Tympanic    Resp: 12    Height: 1.6 m (5\' 3" )    Weight: 52.617 kg (116 lb)    SpO2: 98%          Physical Exam   Constitutional: She is oriented to person, place, and time. She appears well-developed and well-nourished.   HENT:   Nose: Nose normal.   Mouth/Throat: Oropharynx is clear and moist.   Eyes: Conjunctivae and EOM are normal. Pupils are equal, round, and reactive to light.   Neck: Normal range of motion. Neck supple. No JVD present. No tracheal deviation present. No thyromegaly present.   Cardiovascular: Normal rate, regular rhythm, normal heart sounds and intact distal pulses.  Exam reveals no gallop and no friction rub.    No murmur heard.  Pulmonary/Chest: Effort normal and breath sounds normal. No respiratory distress. She has no wheezes. She has no rales.   Abdominal: Soft. Bowel sounds  are normal. She exhibits no distension. There is no tenderness.   Musculoskeletal: Normal range of motion. She exhibits  no edema or tenderness.   Lymphadenopathy:     She has no cervical adenopathy.   Neurological: She is alert and oriented to person, place, and time. She has normal reflexes. No cranial nerve deficit. Coordination normal.   Skin: Skin is warm and dry. She is not diaphoretic. No erythema.   Psychiatric: Her speech is normal and behavior is normal. Judgment and thought content normal. Her mood appears anxious. Cognition and memory are normal.   Vitals reviewed.            Signed,  Clydell Hakim, MD  5:07 PM 06/22/2014

## 2014-06-23 LAB — CBC AND DIFFERENTIAL
Atypical Lymphocytes %: 0 %
Baso(Absolute): 24 cells/uL (ref 0–200)
Basophils: 0.3 %
Eosinophils Absolute: 152 cells/uL (ref 15–500)
Eosinophils: 1.9 %
Hematocrit: 41.7 % (ref 35.0–45.0)
Hemoglobin: 13.7 g/dL (ref 11.7–15.5)
Lymphocytes Absolute: 1632 cells/uL (ref 850–3900)
Lymphocytes: 20.4 %
MCH: 31.6 pg (ref 27–33)
MCHC: 33 g/dL (ref 32–36)
MCV: 96 fL (ref 80–100)
MPV: 7.1 fL — ABNORMAL LOW (ref 7.5–11.5)
Monocytes Absolute: 768 cells/uL (ref 200–950)
Monocytes: 9.6 %
Neutrophils Absolute: 5424 cells/uL (ref 1500–7800)
Neutrophils: 67.8 %
Platelets: 305 10*3/uL (ref 140–400)
RBC: 4.34 10*6/uL (ref 3.80–5.10)
RDW: 13.6 % (ref 11.0–15.0)
WBC: 8 10*3/uL (ref 3.8–10.8)

## 2014-06-23 LAB — COMPREHENSIVE METABOLIC PANEL
ALT: 20 U/L (ref 6–29)
AST (SGOT): 28 U/L (ref 10–35)
Albumin/Globulin Ratio: 2.7 — ABNORMAL HIGH (ref 1.0–2.5)
Albumin: 4.8 G/DL (ref 3.6–5.1)
Alkaline Phosphatase: 56 U/L (ref 33–130)
BUN / Creatinine Ratio: 20.7 (ref 6–22)
BUN: 23 MG/DL (ref 7–25)
Bilirubin, Total: 0.5 MG/DL (ref 0.2–1.2)
CO2: 27 mmol/L (ref 19–30)
Calcium: 9.7 MG/DL (ref 8.6–10.4)
Chloride: 92 mmol/L — ABNORMAL LOW (ref 98–110)
Creatinine: 1.11 mg/dL — ABNORMAL HIGH (ref 0.50–0.99)
EGFR African American: 61 mL/min/{1.73_m2} (ref >=60)
EGFR: 52 mL/min/{1.73_m2} — AB (ref >=60)
Globulin: 1.8 G/DL — ABNORMAL LOW (ref 1.9–3.7)
Glucose: 44 MG/DL — ABNORMAL LOW (ref 65–99)
Potassium: 4.3 mmol/L (ref 3.5–5.3)
Protein, Total: 6.6 G/DL (ref 6.1–8.1)
Sodium: 137 mmol/L (ref 135–146)

## 2014-06-23 LAB — IRON PROFILE
Iron Saturation: 33 % (ref 11–50)
Iron: 105 ug/dL (ref 45–160)
TIBC: 314 ug/dL (ref 250–450)

## 2014-06-23 LAB — FERRITIN: Ferritin: 179 ng/mL (ref 20–288)

## 2014-09-08 ENCOUNTER — Ambulatory Visit (INDEPENDENT_AMBULATORY_CARE_PROVIDER_SITE_OTHER): Payer: Commercial Indemnity | Admitting: Internal Medicine

## 2014-11-11 ENCOUNTER — Ambulatory Visit (INDEPENDENT_AMBULATORY_CARE_PROVIDER_SITE_OTHER): Payer: Commercial Managed Care - POS | Admitting: Internal Medicine

## 2014-11-11 ENCOUNTER — Encounter (INDEPENDENT_AMBULATORY_CARE_PROVIDER_SITE_OTHER): Payer: Self-pay | Admitting: Internal Medicine

## 2014-11-11 VITALS — BP 125/85 | HR 86 | Temp 97.3°F | Resp 12 | Ht 63.0 in | Wt 111.0 lb

## 2014-11-11 DIAGNOSIS — G8929 Other chronic pain: Secondary | ICD-10-CM

## 2014-11-11 DIAGNOSIS — N189 Chronic kidney disease, unspecified: Secondary | ICD-10-CM

## 2014-11-11 DIAGNOSIS — R51 Headache: Secondary | ICD-10-CM

## 2014-11-11 DIAGNOSIS — I10 Essential (primary) hypertension: Secondary | ICD-10-CM

## 2014-11-11 MED ORDER — DILTIAZEM HCL ER COATED BEADS 120 MG PO CP24
120.0000 mg | ORAL_CAPSULE | Freq: Every day | ORAL | Status: DC
Start: 2014-11-11 — End: 2015-07-14

## 2014-11-11 NOTE — Progress Notes (Signed)
[facility] PRIMARY CARE  [place]  PROGRESS NOTE      Patient: Katherine Mays   Date: 11/11/2014   MRN: [id number]     ASSESSMENT/PLAN     Tamkia Temples is a 63 y.o. female    Chief Complaint   Patient presents with   . Hypertension   . Chronic Kidney Disease   . Headache        1. Essential hypertension, benign  The risks and complications of high blood pressure have been explained. Importance of low salt diet, and weight control have been explained.    BP is controlled   Refill:   - diltiazem (CARDIZEM CD) 120 MG 24 hr capsule; Take 1 capsule (120 mg total) by mouth daily.  Dispense: 90 capsule; Refill: 3    2. CKD (chronic kidney disease), unspecified stage  Her creatinine fluctuates between 0.8 to 1.22.  Last blood tests performed in October 2015 by GP has shown that creatinine and BUN were normal.   She had another blood tests performed last week which were ordered by her nephrologist.  She will for further reports to me.  She will continue to avoid nonsteroidal anti-inflammatory medications.  Importance of adequate hydration has been explained.    3. Chronic nonintractable headache, unspecified headache type  She has a chronic, and some type of headache for few months.  She had an MRI of the brain performed about a month ago and was reported to be normal according to her.  She was started on tramadol 2-3 times a day.  Given by her general practitioner.  Possibility of interaction between Seroquel and tramadol have been discussed.  I have recommended her to consult a neurologist, if headache persist.  If she requires any help in making the appointment with neurologist, she will let me know.           MEDICATIONS     Current Outpatient Prescriptions   Medication Sig Dispense Refill   . busPIRone (BUSPAR) 30 MG tablet Take 10 mg by mouth 2 (two) times daily.        . Calcium Carbonate-Vitamin D (CALCIUM + D PO) Take by mouth.     . Cholecalciferol (VITAMIN D3 PO) Take by mouth.     . Cyanocobalamin (VITAMIN B-12 PO) Take  by mouth.     . denosumab (PROLIA) 60 MG/ML Solution subcutaneous injection Inject 60 mg into the skin once.     . diltiazem (CARDIZEM CD) 120 MG 24 hr capsule Take 1 capsule (120 mg total) by mouth daily. 90 capsule 3   . HydrOXYzine HCl (ATARAX PO) Take by mouth.     . iron-vitamin C (VITRON-C) 65-125 MG Tab Take by mouth.     . LamoTRIgine 200 MG Tablet SR 24 hr Take 100 mg by mouth nightly.        . levothyroxine (SYNTHROID) 75 MCG tablet Take 75 mcg by mouth.     . liothyronine (CYTOMEL) 5 MCG tablet Take 5 mcg by mouth.     . QUEtiapine (SEROQUEL) 50 MG tablet Take 50 mg by mouth nightly.        . QUEtiapine Fumarate (SEROQUEL XR) 150 MG Tablet SR 24 hr Take 150 mg by mouth nightly.     . traMADol (ULTRAM) 50 MG tablet Once a day - up to four times ago for headaches       . valacyclovir (VALTREX) 1000 MG tablet      . venlafaxine (EFFEXOR-XR) 150 MG 24 hr capsule  Take 150 mg by mouth.     . zolpidem (AMBIEN CR) 6.25 MG CR tablet Take 6.25 mg by mouth.       No current facility-administered medications for this visit.       Allergies   Allergen Reactions   . Keflex [Cephalexin]        SUBJECTIVE     Chief Complaint   Patient presents with   . Hypertension   . Chronic Kidney Disease   . Headache        Hypertension  This is a chronic problem. The current episode started more than 1 year ago. The problem is controlled. Associated symptoms include headaches and neck pain. Pertinent negatives include no chest pain, palpitations, peripheral edema, PND or shortness of breath. There are no associated agents to hypertension. Risk factors for coronary artery disease include post-menopausal state. Past treatments include calcium channel blockers. The current treatment provides significant improvement. There are no compliance problems.  There is no history of CAD/MI or CVA. Identifiable causes of hypertension include chronic renal disease.   Headache   This is a recurrent problem. Episode onset: 3 months  The problem  occurs intermittently. The problem has been waxing and waning. The pain is located in the bilateral and occipital region. The pain radiates to the upper back, left shoulder and right shoulder. The pain quality is similar to prior headaches. The quality of the pain is described as aching, boring and dull. The pain is at a severity of 5/10. The pain is moderate. Associated symptoms include nausea, neck pain, scalp tenderness and sinus pressure. Pertinent negatives include no fever, hearing loss, insomnia, numbness, phonophobia, photophobia or vomiting. The symptoms are aggravated by emotional stress. She has tried acetaminophen (Tramadol ) for the symptoms. The treatment provided mild relief. Her past medical history is significant for hypertension.   CKD: She has a history of mild chronic kidney disease.  Her creatinine fluctuates between 0.8-1.22.  She is currently followed by a nephrologist and being treated conservatively.  She denies any nausea, vomiting or abdominal pain.  She brought in lab results that were ordered by her general practitioner and I have reviewed it.  last BUN, creatinine in October 2015 are normal.        ROS     Review of Systems   Constitutional: Positive for fatigue. Negative for fever.   HENT: Positive for sinus pressure. Negative for hearing loss.    Eyes: Negative for photophobia.   Respiratory: Negative for shortness of breath, wheezing and stridor.    Cardiovascular: Negative for chest pain, palpitations and PND.   Gastrointestinal: Positive for nausea. Negative for vomiting, constipation, blood in stool, anal bleeding and rectal pain.   Musculoskeletal: Positive for myalgias, arthralgias and neck pain. Negative for gait problem and neck stiffness.   Skin: Negative.    Neurological: Positive for headaches. Negative for numbness.   Psychiatric/Behavioral: Positive for sleep disturbance. The patient does not have insomnia.    All other systems reviewed and are negative.          The  following portions of the patient's history were reviewed and updated as appropriate: Allergies, Current Medications, Past Family History, Past Medical history, Past social history, Past surgical history, and Problem List.      PHYSICAL EXAM     Filed Vitals:    11/11/14 1320   BP: 125/85   Pulse: 86   Temp: 97.3 F (36.3 C)   TempSrc: Tympanic   Resp: 12  Height: 1.6 m (5\' 3" )   Weight: 50.349 kg (111 lb)   SpO2: 98%         Physical Exam   Constitutional: She is oriented to person, place, and time. She appears well-developed and well-nourished.   HENT:   Nose: Nose normal.   Mouth/Throat: Oropharynx is clear and moist.   Eyes: Conjunctivae and EOM are normal. Pupils are equal, round, and reactive to light.   Neck: Normal range of motion. Neck supple. No JVD present. Muscular tenderness present. No spinous process tenderness present. No rigidity. No tracheal deviation, no edema, no erythema and normal range of motion present. No thyromegaly present.   Cardiovascular: Normal rate, regular rhythm, normal heart sounds and intact distal pulses.  Exam reveals no gallop and no friction rub.    No murmur heard.  Pulmonary/Chest: Effort normal and breath sounds normal. No respiratory distress. She has no wheezes. She has no rales.   Abdominal: Soft. Bowel sounds are normal. She exhibits no distension. There is no tenderness. There is no rebound.   Musculoskeletal: Normal range of motion. She exhibits no edema or tenderness.   Lymphadenopathy:     She has no cervical adenopathy.   Neurological: She is alert and oriented to person, place, and time. No cranial nerve deficit. Coordination normal.   Skin: Skin is warm and dry. No erythema.   Psychiatric: She has a normal mood and affect. Her behavior is normal.   Vitals reviewed.            Signed,  Clydell Hakim, MD  11/11/2014

## 2014-11-22 ENCOUNTER — Ambulatory Visit (INDEPENDENT_AMBULATORY_CARE_PROVIDER_SITE_OTHER): Payer: Commercial Managed Care - POS | Admitting: Internal Medicine

## 2015-06-29 ENCOUNTER — Encounter (INDEPENDENT_AMBULATORY_CARE_PROVIDER_SITE_OTHER): Payer: Self-pay

## 2015-07-07 ENCOUNTER — Ambulatory Visit (INDEPENDENT_AMBULATORY_CARE_PROVIDER_SITE_OTHER): Payer: Medicare Other | Admitting: Internal Medicine

## 2015-07-07 ENCOUNTER — Encounter (INDEPENDENT_AMBULATORY_CARE_PROVIDER_SITE_OTHER): Payer: Self-pay | Admitting: Internal Medicine

## 2015-07-07 VITALS — BP 130/80 | HR 71 | Temp 97.2°F | Resp 12 | Ht 63.0 in | Wt 107.0 lb

## 2015-07-07 DIAGNOSIS — I10 Essential (primary) hypertension: Secondary | ICD-10-CM

## 2015-07-07 DIAGNOSIS — E875 Hyperkalemia: Secondary | ICD-10-CM

## 2015-07-07 DIAGNOSIS — R42 Dizziness and giddiness: Secondary | ICD-10-CM

## 2015-07-07 DIAGNOSIS — E039 Hypothyroidism, unspecified: Secondary | ICD-10-CM

## 2015-07-07 DIAGNOSIS — F331 Major depressive disorder, recurrent, moderate: Secondary | ICD-10-CM

## 2015-07-07 DIAGNOSIS — N189 Chronic kidney disease, unspecified: Secondary | ICD-10-CM

## 2015-07-07 LAB — RENAL FUNCTION PANEL
Albumin: 4.6 g/dL (ref 3.5–5.0)
BUN: 13 mg/dL (ref 7.0–19.0)
CO2: 28 mEq/L (ref 21–30)
Calcium: 10.5 mg/dL (ref 8.5–10.5)
Chloride: 96 mEq/L — ABNORMAL LOW (ref 100–111)
Creatinine: 0.8 mg/dL (ref 0.4–1.5)
Glucose: 78 mg/dL (ref 70–100)
Phosphorus: 3.5 mg/dL (ref 2.3–4.7)
Potassium: 5.3 mEq/L (ref 3.5–5.3)
Sodium: 138 mEq/L (ref 135–146)

## 2015-07-07 LAB — HEMOLYSIS INDEX: Hemolysis Index: 45 — ABNORMAL HIGH (ref 0–18)

## 2015-07-07 LAB — GFR: EGFR: >60

## 2015-07-07 NOTE — Progress Notes (Signed)
[facility] PRIMARY CARE  [place]  PROGRESS NOTE      Patient: Katherine Mays   Date: 07/07/2015   MRN: [id number]     ASSESSMENT/PLAN     Katherine Mays is a 64 y.o. female    Chief Complaint   Patient presents with   . Anxiety   . Hypertension   . Dizziness   . Lack Of Coordination     feeling unstable; difficult processing what is said - need to be repeated / reminders   . Hot Flashes   . Migraine        1. Essential hypertension, benign  Blood pressures control at present time.  I will recommend patient to continue Cardizem CD 120 mg once a day.  Oral hydration is encouraged. The risks and complications of high blood pressure have been explained. Importance of low salt diet, and weight control have been explained.      - Renal function panel    2. CKD (chronic kidney disease), unspecified stage  She has a chronic renal dysfunction.  She currently sees another primary care physician.  Recent blood tests by her PCP showed potassium level to be 5.6 which is mildly elevated.  Patient has been drinking coconut water in the significant amount.  I advised patient not to drink coconut water and also avoid orange juice and citrus fruit.     - Renal function panel    3. Major depressive disorder, recurrent episode, moderate  It seems that depression symptoms have become worse since reduction of the dose of Effexor XR from 150 mg to 75 mg.  She is also experiencing some withdrawal symptoms.  I have made her a referral to in the left behavioral health services and advised to resume 150 mg dose until she consults psychiatrist.  She also uses Ambien daily at bedtime when necessary for sleep.      - Ambulatory Behavioral Health Referral to [facility] - 7 Days    4. Vertigo  Fall precautions are explained.  Oral hydration is encouraged.      5. Hyperkalemia  As in number.  2.    6. Hypothyroidism, unspecified hypothyroidism type  Last thyroid function test 1 month ago was normal.  She will continue Synthroid 75 g daily.  She has a follow-up  appointment with endocrinologist.           MEDICATIONS     Current Outpatient Prescriptions   Medication Sig Dispense Refill   . denosumab (PROLIA) 60 MG/ML Solution subcutaneous injection Inject 60 mg into the skin once.     . diltiazem (CARDIZEM CD) 120 MG 24 hr capsule Take 1 capsule (120 mg total) by mouth daily. 90 capsule 3   . levothyroxine (SYNTHROID) 75 MCG tablet Take 75 mcg by mouth.     . traMADol (ULTRAM) 50 MG tablet Once a day - up to four times ago for headaches       . venlafaxine (EFFEXOR-XR) 150 MG 24 hr capsule Take 75 mg by mouth daily.        Marland Kitchen zolpidem (AMBIEN CR) 6.25 MG CR tablet Take 6.25 mg by mouth.       No current facility-administered medications for this visit.       Allergies   Allergen Reactions   . Keflex [Cephalexin]        SUBJECTIVE     Chief Complaint   Patient presents with   . Anxiety   . Hypertension   . Dizziness   .  Lack Of Coordination     feeling unstable; difficult processing what is said - need to be repeated / reminders   . Hot Flashes   . Migraine        HPI Comments: Patient is followed by the PCP.  She was also seeing a psychiatrist, but she has terminated care with that psychiatrist several weeks ago.  Her PCP reduced Effexor XR from 150 to 75 mg one month ago.  She started experiencing lightheadedness, dizziness, sweating and more depressed one month ago.  She is experiencing nausea and more frequent headaches now.  She had a blood test performed by PCP on June 08, 2015, which has able to mild chronic renal failure and mild hyperkalemia.  Thyroid function tests were normal at that time.  She also had B12 level which was normal.        Anxiety  Presents for follow-up visit. Onset was 1 to 4 weeks ago. The problem has been gradually worsening. Symptoms include confusion, decreased concentration, dizziness, excessive worry, insomnia, malaise, muscle tension, nausea and nervous/anxious behavior. Patient reports no chest pain or suicidal ideas. Symptoms occur most  days. The severity of symptoms is severe.     Past treatments include non-SSRI antidepressants. Compliance with prior treatments has been variable.   Hypertension  This is a chronic problem. The current episode started more than 1 year ago. The problem is controlled. Associated symptoms include anxiety and headaches. Pertinent negatives include no chest pain. There are no associated agents to hypertension. Risk factors for coronary artery disease include sedentary lifestyle and post-menopausal state. Past treatments include calcium channel blockers. The current treatment provides significant improvement. Compliance problems include exercise.  There is no history of angina.   Dizziness  This is a new problem. The current episode started 1 to 4 weeks ago. The problem occurs intermittently. The problem has been waxing and waning. Associated symptoms include arthralgias, diaphoresis, fatigue, headaches, myalgias, nausea and weakness. Pertinent negatives include no abdominal pain, chest pain, coughing or fever. The symptoms are aggravated by bending and twisting. She has tried nothing for the symptoms.   Migraine   This is a recurrent problem. The current episode started more than 1 month ago. The pain is located in the bilateral and frontal region. The pain quality is similar to prior headaches. The quality of the pain is described as aching, boring and throbbing. The pain is at a severity of 5/10. The pain is moderate. Associated symptoms include dizziness, insomnia, nausea and weakness. Pertinent negatives include no abdominal pain, coughing, fever or seizures. The symptoms are aggravated by bright light and emotional stress. She has tried acetaminophen for the symptoms. The treatment provided no relief. Her past medical history is significant for migraine headaches.           ROS     Review of Systems   Constitutional: Positive for diaphoresis and fatigue. Negative for fever and unexpected weight change.    Respiratory: Negative for cough, choking and chest tightness.    Cardiovascular: Negative for chest pain.   Gastrointestinal: Positive for nausea. Negative for abdominal pain.   Musculoskeletal: Positive for myalgias and arthralgias.   Neurological: Positive for dizziness, weakness, light-headedness and headaches. Negative for seizures and syncope.   Psychiatric/Behavioral: Positive for confusion, sleep disturbance and decreased concentration. Negative for suicidal ideas, behavioral problems, self-injury and agitation. The patient is nervous/anxious and has insomnia.    All other systems reviewed and are negative.  The following portions of the patient's history were reviewed and updated as appropriate: Allergies, Current Medications, Past Family History, Past Medical history, Past social history, Past surgical history, and Problem List.      PHYSICAL EXAM     Filed Vitals:    07/07/15 1134 07/07/15 1201 07/07/15 1202   BP: 151/104 134/84 130/80   Pulse: 71     Temp: 97.2 F (36.2 C)     TempSrc: Tympanic     Resp: 12     Height: 1.6 m (5\' 3" )     Weight: 48.535 kg (107 lb)           Physical Exam   Constitutional: She is oriented to person, place, and time. She appears well-developed and well-nourished.   HENT:   Mouth/Throat: Oropharynx is clear and moist.   Eyes: Conjunctivae and EOM are normal. Pupils are equal, round, and reactive to light.   Neck: Normal range of motion. Neck supple. No thyromegaly present.   Cardiovascular: Normal rate, regular rhythm and normal heart sounds.    Pulmonary/Chest: Effort normal and breath sounds normal. No respiratory distress. She has no wheezes.   Abdominal: Soft. Bowel sounds are normal. She exhibits no distension. There is no tenderness. There is no rebound.   Musculoskeletal: Normal range of motion. She exhibits no edema or tenderness.   Lymphadenopathy:     She has no cervical adenopathy.   Neurological: She is alert and oriented to person, place, and time. She  has normal reflexes. No cranial nerve deficit.   Skin: Skin is warm and dry. No rash noted.   Psychiatric: Judgment and thought content normal. Her mood appears anxious. Her speech is delayed. She is slowed. Cognition and memory are normal. She exhibits a depressed mood.   Vitals reviewed.            Signed,  Clydell Hakim, MD  07/07/2015

## 2015-07-07 NOTE — Patient Instructions (Signed)
Venlafaxine Hydrochloride Oral capsule, extended-release  What is this medicine?  VENLAFAXINE(VEN la fax een) is used to treat depression, anxiety and panic disorder.  This medicine may be used for other purposes; ask your health care provider or pharmacist if you have questions.  What should I tell my health care provider before I take this medicine?  They need to know if you have any of these conditions:   bleeding disorders   glaucoma   heart disease   high blood pressure   high cholesterol   kidney disease   liver disease   low levels of sodium in the blood   mania or bipolar disorder   seizures   suicidal thoughts, plans, or attempt; a previous suicide attempt by you or a family   take medicines that treat or prevent blood clots   thyroid disease   an unusual or allergic reaction to venlafaxine, desvenlafaxine, other medicines, foods, dyes, or preservatives   pregnant or trying to get pregnant   breast-feeding  How should I use this medicine?  Take this medicine by mouth with a full glass of water. Follow the directions on the prescription label. Do not cut, crush, or chew this medicine. Take it with food. If needed, the capsule may be carefully opened and the entire contents sprinkled on a spoonful of cool applesauce. Swallow the applesauce/pellet mixture right away without chewing and follow with a glass of water to ensure complete swallowing of the pellets. Try to take your medicine at about the same time each day. Do not take your medicine more often than directed. Do not stop taking this medicine suddenly except upon the advice of your doctor. Stopping this medicine too quickly may cause serious side effects or your condition may worsen.  A special MedGuide will be given to you by the pharmacist with each prescription and refill. Be sure to read this information carefully each time.  Talk to your pediatrician regarding the use of this medicine in children. Special care may be  needed.  Overdosage: If you think you have taken too much of this medicine contact a poison control center or emergency room at once.  NOTE: This medicine is only for you. Do not share this medicine with others.  What if I miss a dose?  If you miss a dose, take it as soon as you can. If it is almost time for your next dose, take only that dose. Do not take double or extra doses.  What may interact with this medicine?  Do not take this medicine with any of the following medications:   certain medicines for fungal infections like fluconazole, itraconazole, ketoconazole, posaconazole, voriconazole   cisapride   desvenlafaxine   dofetilide   dronedarone   duloxetine   levomilnacipran   linezolid   MAOIs like Carbex, Eldepryl, Marplan, Nardil, and Parnate   methylene blue (injected into a vein)   milnacipran   pimozide   thioridazine   ziprasidone  This medicine may also interact with the following medications:   aspirin and aspirin-like medicines   certain medicines for depression, anxiety, or psychotic disturbances   certain medicines for migraine headaches like almotriptan, eletriptan, frovatriptan, naratriptan, rizatriptan, sumatriptan, zolmitriptan   certain medicines for sleep   certain medicines that treat or prevent blood clots like dalteparin, enoxaparin, warfarin   cimetidine   clozapine   diuretics   fentanyl   furazolidone   indinavir   isoniazid   lithium   metoprolol   NSAIDS, medicines  for pain and inflammation, like ibuprofen or naproxen   other medicines that prolong the QT interval (cause an abnormal heart rhythm)   procarbazine   rasagiline   supplements like St. John's wort, kava kava, valerian   tramadol   tryptophan  This list may not describe all possible interactions. Give your health care provider a list of all the medicines, herbs, non-prescription drugs, or dietary supplements you use. Also tell them if you smoke, drink alcohol, or use illegal drugs. Some  items may interact with your medicine.  What should I watch for while using this medicine?  Tell your doctor if your symptoms do not get better or if they get worse. Visit your doctor or health care professional for regular checks on your progress. Because it may take several weeks to see the full effects of this medicine, it is important to continue your treatment as prescribed by your doctor.  Patients and their families should watch out for new or worsening thoughts of suicide or depression. Also watch out for sudden changes in feelings such as feeling anxious, agitated, panicky, irritable, hostile, aggressive, impulsive, severely restless, overly excited and hyperactive, or not being able to sleep. If this happens, especially at the beginning of treatment or after a change in dose, call your health care professional.  This medicine can cause an increase in blood pressure. Check with your doctor for instructions on monitoring your blood pressure while taking this medicine.  You may get drowsy or dizzy. Do not drive, use machinery, or do anything that needs mental alertness until you know how this medicine affects you. Do not stand or sit up quickly, especially if you are an older patient. This reduces the risk of dizzy or fainting spells. Alcohol may interfere with the effect of this medicine. Avoid alcoholic drinks.  Your mouth may get dry. Chewing sugarless gum, sucking hard candy and drinking plenty of water will help. Contact your doctor if the problem does not go away or is severe.  What side effects may I notice from receiving this medicine?  Side effects that you should report to your doctor or health care professional as soon as possible:   allergic reactions like skin rash, itching or hives, swelling of the face, lips, or tongue   breathing problems   changes in vision   hallucination, loss of contact with reality   seizures   suicidal thoughts or other mood changes   trouble passing urine or  change in the amount of urine   unusual bleeding or bruising  Side effects that usually do not require medical attention (report to your doctor or health care professional if they continue or are bothersome):   change in sex drive or performance   constipation   increased sweating   loss of appetite   nausea   tremors   weight loss  This list may not describe all possible side effects. Call your doctor for medical advice about side effects. You may report side effects to FDA at 1-800-FDA-1088.  Where should I keep my medicine?  Keep out of the reach of children.  Store at a controlled temperature between 20 and 25 degrees C (68 degrees and 77 degrees F), in a dry place. Throw away any unused medicine after the expiration date.  NOTE:This sheet is a summary. It may not cover all possible information. If you have questions about this medicine, talk to your doctor, pharmacist, or health care provider. Copyright 2015 [employer]

## 2015-07-11 ENCOUNTER — Encounter (HOSPITAL_BASED_OUTPATIENT_CLINIC_OR_DEPARTMENT_OTHER): Payer: Self-pay | Admitting: Mental Health

## 2015-07-11 NOTE — Progress Notes (Signed)
Attempted, without success,  to reach pt by phone to f/u on behavioral health referral. Left generic message for pt to call regarding referral placed by Dr. Allena Katz.  Will attempt to reach pt at a later time if pt does not return call.

## 2015-07-14 ENCOUNTER — Other Ambulatory Visit (INDEPENDENT_AMBULATORY_CARE_PROVIDER_SITE_OTHER): Payer: Self-pay | Admitting: Internal Medicine

## 2015-07-14 ENCOUNTER — Encounter (INDEPENDENT_AMBULATORY_CARE_PROVIDER_SITE_OTHER): Payer: Self-pay | Admitting: Internal Medicine

## 2015-07-14 DIAGNOSIS — I10 Essential (primary) hypertension: Secondary | ICD-10-CM

## 2015-07-14 MED ORDER — DILTIAZEM HCL ER COATED BEADS 120 MG PO CP24
120.0000 mg | ORAL_CAPSULE | Freq: Every day | ORAL | Status: DC
Start: 2015-07-14 — End: 2016-04-15

## 2015-07-14 MED ORDER — DILTIAZEM HCL ER COATED BEADS 120 MG PO CP24
120.0000 mg | ORAL_CAPSULE | Freq: Every day | ORAL | Status: DC
Start: 2015-07-14 — End: 2015-07-14

## 2015-07-19 ENCOUNTER — Encounter (INDEPENDENT_AMBULATORY_CARE_PROVIDER_SITE_OTHER): Payer: Self-pay | Admitting: Internal Medicine

## 2015-07-31 NOTE — Progress Notes (Signed)
Attempted to reach pt regarding referral from Dr. Allena Katz.  Left message for pt to call. Pt has not returned earlier message.

## 2015-09-01 ENCOUNTER — Ambulatory Visit (INDEPENDENT_AMBULATORY_CARE_PROVIDER_SITE_OTHER): Payer: Medicare Other | Admitting: Neurology

## 2015-09-01 VITALS — BP 129/82 | HR 89 | Ht 63.0 in | Wt 112.6 lb

## 2015-09-01 DIAGNOSIS — G43009 Migraine without aura, not intractable, without status migrainosus: Secondary | ICD-10-CM

## 2015-09-01 MED ORDER — BUTALBITAL-APAP-CAFFEINE 50-325-40 MG PO TABS
1.0000 | ORAL_TABLET | ORAL | Status: DC | PRN
Start: 2015-09-01 — End: 2016-05-27

## 2015-09-01 MED ORDER — NORTRIPTYLINE HCL 25 MG PO CAPS
25.0000 mg | ORAL_CAPSULE | Freq: Every evening | ORAL | Status: DC
Start: 2015-09-01 — End: 2016-04-15

## 2015-09-01 NOTE — Progress Notes (Signed)
Is the patient taking current medications?Yes  Has there been any changes to current medication list?no change    Document any barriers/adverse reactions to current medicationsnone  CURRENT BARRIERS TO TAKING MEDICATION:No    Has the patient sought any care outside of the [facility]?No

## 2015-09-01 NOTE — Progress Notes (Signed)
Subjective:       Patient ID: Katherine Mays is a 64 y.o. female.    HPI    The patient is a pleasant 64 years old right-handed Caucasian female who is here for   evaluation of headaches.  The patient reports the headaches have been there for many   years.  Lately have been getting more frequent and severe. He reports the headaches   are coming on daily basis, described as pressure-like feeling, sometimes throbbing   or ice pick stabbing behind the eyes.  They are associated with nausea, vomiting,   photosensitivity, phono sensitivity, dizziness.   She was taking aspirin on daily basis for these headaches and was diagnosed   with chronic kidney disease.  She has stopped taking anymore nonsteroidal   anti-inflammatory drug's and her primary care doctor has given her tramadol   for headaches, which may or may not help.  She does have history of severe   anxiety, depression and stress.  She takes hydroxyzine for her stress, but since   she is not able to sleep despite taking Ambien, so she has started taking hydroxyzine   twice daily.  She is also complaining of neck pain with difficulty moving her neck   from side-to-side.  Neck pain does not shoot down the arms.    ALLERGIES: Patient is ALLERGIC to Keflex.    Social history: She denies smoking, does take alcohol socially, denies any drug abuse.    Family history: No history of migraines in the family.    The following portions of the patient's history were reviewed and updated as appropriate: allergies, current medications, past family history, past medical history, past social history, past surgical history and problem list.    Review of Systems   Constitutional: Positive for activity change and fatigue.   Eyes: Positive for photophobia.   Respiratory: Negative.    Cardiovascular: Negative.    Endocrine: Negative.    Genitourinary: Negative.    Musculoskeletal: Positive for arthralgias and neck pain.   Skin: Negative.    Allergic/Immunologic: Negative.     Neurological: Positive for dizziness, numbness and headaches.   Hematological: Negative.    Psychiatric/Behavioral: Positive for sleep disturbance and decreased concentration. The patient is nervous/anxious.            Objective:    Physical Exam    Constitutional: Vital signs reviewed.   Head: Normocephalic, atraumatic, neck supple no JVD  Eyes: No conjunctival injection. No discharge.   ENT: Mucous membranes moist   Neck: Normal range of motion. Non tender.   Respiratory/Chest: Clear to auscultation. No respiratory distress.   Cardiovascular: Regular rate and rhythm. No murmur.   Lower Extremity: No edema. No cyanosis.   Skin: Warm and dry. No rash.   Lymphatic: No cervical lymphadenopathy.   Psychiatric: Normal affect.     Mental Status: The patient was awake, alert, and oriented. Conversation   was appropriate. Speech was fluent, and the patient followed commands   consistently.  Cranial Nerves: Pupils were equally round and reactive to light. Extraocular  movements were intact, without nystagmus. The patient's face was symmetric,   and facial sensation was intact. Tongue and palate were midline.   Motor Exam: The patient had full strength throughout. Tone and bulk appeared   normal. There were no abnormal movements or tremor noted.   Sensation: Light touch, pinprick, and joint position sense were intact.   Coordination: Finger-to-nose and heel-to-shin testing was intact   without dysmetria. Romberg  testing was negative.   Gait: Normal, with normal tandem walking.   Deep Tendon Reflexes: 2+ and symmetric throughout.   Toes were downgoing.        Assessment:       64 years old female presenting with headaches likely combination of   migraines and chronic daily headaches secondary to stress.  Neck pain/neck stiffness.  Severe stress/anxiety/depression.  Hypertension.  Chronic kidney disease.  Hypothyroidism.  Osteoporosis      Plan:      Procedures    She will take Nortriptyline 25 mg at bedtime, the side  effects including sedation   and sleepiness were discussed with the patient and she expressed her understanding.  Fioricet to be taken at the early onset of headaches.  Wean off hydroxyzine taking once daily for 1 week and then every other day for 1 week.  Lifestyle modification, The patient was advised about life style modifications   as avoiding certain triggers like  lack of sleep, dehydration, taking regular meals,   avoiding coffee, chocolate and look for certain foods which may be the trigger factors.   The patient will keep a headache diary and will bring it in the next visit.  Continue Effexor 150 mg once daily  Cardizem 120 mg once daily.  Synthroid 75 g once daily.  Follow-up in one month.    The patient will return in follow-up as outlined above. If there are any new neurologic symptoms or worsening of current symptoms, the patient is instructed to contact us by telephone.    Lynann Bologna, MD  Neurology, Neurophysiology  [facility]

## 2015-10-02 ENCOUNTER — Ambulatory Visit (INDEPENDENT_AMBULATORY_CARE_PROVIDER_SITE_OTHER): Payer: Medicare Other | Admitting: Neurology

## 2015-12-17 ENCOUNTER — Encounter (INDEPENDENT_AMBULATORY_CARE_PROVIDER_SITE_OTHER): Payer: Self-pay

## 2016-03-16 ENCOUNTER — Other Ambulatory Visit (INDEPENDENT_AMBULATORY_CARE_PROVIDER_SITE_OTHER): Payer: Self-pay | Admitting: Internal Medicine

## 2016-04-01 ENCOUNTER — Other Ambulatory Visit: Payer: Self-pay | Admitting: Rheumatology

## 2016-04-15 ENCOUNTER — Encounter (INDEPENDENT_AMBULATORY_CARE_PROVIDER_SITE_OTHER): Payer: Self-pay | Admitting: Internal Medicine

## 2016-04-15 ENCOUNTER — Ambulatory Visit (INDEPENDENT_AMBULATORY_CARE_PROVIDER_SITE_OTHER): Payer: Medicare Other | Admitting: Internal Medicine

## 2016-04-15 VITALS — BP 118/70 | HR 96 | Temp 97.7°F | Resp 12 | Ht 63.0 in | Wt 114.0 lb

## 2016-04-15 DIAGNOSIS — I10 Essential (primary) hypertension: Secondary | ICD-10-CM

## 2016-04-15 DIAGNOSIS — G8929 Other chronic pain: Secondary | ICD-10-CM

## 2016-04-15 DIAGNOSIS — R51 Headache: Secondary | ICD-10-CM

## 2016-04-15 MED ORDER — DILTIAZEM HCL ER COATED BEADS 120 MG PO CP24
120.0000 mg | ORAL_CAPSULE | Freq: Every day | ORAL | Status: DC
Start: 2016-04-15 — End: 2016-05-27

## 2016-04-15 NOTE — Progress Notes (Signed)
Have you seen any new specialists/physicians since you were last here? YES    Dr. Shon Hale - Rheumatologist       Limb alert protocol reviewed?  YES

## 2016-04-15 NOTE — Progress Notes (Signed)
[facility] PRIMARY CARE  [place]  PROGRESS NOTE      Patient: Katherine Mays   Date: 04/15/2016   MRN: [id number]     ASSESSMENT/PLAN     Katherine Mays is a 65 y.o. female    Chief Complaint   Patient presents with   . Hypertension   . Headache        1. Essential hypertension, benign  Controlled.  The risks and complications of high blood pressure have been explained. Importance of low salt diet, and weight control have been explained.    Refill:   - dilTIAZem (CARDIZEM CD) 120 MG 24 hr capsule; Take 1 capsule (120 mg total) by mouth daily.  Dispense: 30 capsule; Refill: 0    2. Chronic nonintractable headache, unspecified headache type  .  Patient saw a local neurologists and was prescribed nortriptyline.  Patient tried it, but could not tolerate side effects.  She continues to have a daily headache.  She will be traveling for next 1 month.    She wants to get a 2nd opinion after she returns.           MEDICATIONS     Current Outpatient Prescriptions   Medication Sig Dispense Refill   . butalbital-acetaminophen-caffeine (FIORICET) 50-325-40 MG per tablet Take 1 tablet by mouth every 4 (four) hours as needed for Pain. 30 tablet 1   . dilTIAZem (CARDIZEM CD) 120 MG 24 hr capsule Take 1 capsule (120 mg total) by mouth daily. 30 capsule 0   . hydrOXYzine (ATARAX) 25 MG tablet Take 25 mg by mouth 2 (two) times daily.     Marland Kitchen levothyroxine (SYNTHROID) 75 MCG tablet Take 75 mcg by mouth.     . venlafaxine (EFFEXOR-XR) 150 MG 24 hr capsule Take 75 mg by mouth daily.        Marland Kitchen zolpidem (AMBIEN CR) 6.25 MG CR tablet Take 6.25 mg by mouth.       No current facility-administered medications for this visit.       Allergies   Allergen Reactions   . Keflex [Cephalexin]        SUBJECTIVE     Chief Complaint   Patient presents with   . Hypertension   . Headache        Hypertension  This is a chronic problem. The current episode started more than 1 year ago. The problem is controlled. Associated symptoms include headaches. Pertinent negatives  include no blurred vision, chest pain, palpitations, peripheral edema or shortness of breath. There are no associated agents to hypertension. Risk factors for coronary artery disease include post-menopausal state. Past treatments include calcium channel blockers. The current treatment provides significant improvement. There are no compliance problems.    Headache   This is a chronic problem. The current episode started more than 1 month ago. The problem occurs daily. The problem has been waxing and waning. The pain is located in the bilateral region. The pain quality is similar to prior headaches. The quality of the pain is described as aching and dull. The pain is at a severity of 5/10. The pain is moderate. Pertinent negatives include no blurred vision, dizziness, eye pain, fever, phonophobia, photophobia, scalp tenderness, seizures, sinus pressure or weakness. Nothing aggravates the symptoms. She has tried acetaminophen and NSAIDs for the symptoms.           ROS     Review of Systems   Constitutional: Negative for fever, fatigue and unexpected weight change.   HENT: Negative for  sinus pressure.    Eyes: Negative for blurred vision, photophobia and pain.   Respiratory: Negative for shortness of breath.    Cardiovascular: Negative for chest pain and palpitations.   Musculoskeletal: Negative.    Neurological: Positive for headaches. Negative for dizziness, seizures, syncope, facial asymmetry, speech difficulty, weakness and light-headedness.   Hematological: Negative.    Psychiatric/Behavioral: Positive for sleep disturbance. The patient is nervous/anxious.    All other systems reviewed and are negative.          The following portions of the patient's history were reviewed and updated as appropriate: Allergies, Current Medications, Past Family History, Past Medical history, Past social history, Past surgical history, and Problem List.      PHYSICAL EXAM     Filed Vitals:    04/15/16 1351 04/15/16 1416   BP: 126/83  118/70   Pulse: 96    Temp: 97.7 F (36.5 C)    TempSrc: Tympanic    Resp: 12    Height: 1.6 m (5\' 3" )    Weight: 51.71 kg (114 lb)    SpO2: 97%          Physical Exam   Constitutional: She is oriented to person, place, and time. She appears well-developed and well-nourished.   Eyes: Conjunctivae and EOM are normal. Pupils are equal, round, and reactive to light.   Neck: Normal range of motion. Neck supple. No thyromegaly present.   Cardiovascular: Normal rate, regular rhythm and normal heart sounds.    Pulmonary/Chest: Effort normal and breath sounds normal. No respiratory distress. She has no wheezes. She has no rales.   Neurological: She is alert and oriented to person, place, and time. She has normal reflexes. No cranial nerve deficit. Coordination normal.   Psychiatric: She has a normal mood and affect. Her behavior is normal. Judgment and thought content normal.   Vitals reviewed.            Signed,  Clydell Hakim, MD  04/15/2016

## 2016-05-27 ENCOUNTER — Ambulatory Visit (FREE_STANDING_LABORATORY_FACILITY): Payer: Medicare Other | Admitting: Internal Medicine

## 2016-05-27 ENCOUNTER — Telehealth (INDEPENDENT_AMBULATORY_CARE_PROVIDER_SITE_OTHER): Payer: Self-pay | Admitting: Internal Medicine

## 2016-05-27 ENCOUNTER — Encounter (INDEPENDENT_AMBULATORY_CARE_PROVIDER_SITE_OTHER): Payer: Self-pay | Admitting: Internal Medicine

## 2016-05-27 VITALS — BP 122/82 | HR 83 | Temp 97.4°F | Resp 12 | Ht 63.0 in | Wt 112.0 lb

## 2016-05-27 DIAGNOSIS — N189 Chronic kidney disease, unspecified: Secondary | ICD-10-CM

## 2016-05-27 DIAGNOSIS — Z0000 Encounter for general adult medical examination without abnormal findings: Secondary | ICD-10-CM

## 2016-05-27 DIAGNOSIS — E7800 Pure hypercholesterolemia, unspecified: Secondary | ICD-10-CM

## 2016-05-27 DIAGNOSIS — I10 Essential (primary) hypertension: Secondary | ICD-10-CM

## 2016-05-27 DIAGNOSIS — E039 Hypothyroidism, unspecified: Secondary | ICD-10-CM

## 2016-05-27 LAB — HEMOLYSIS INDEX: Hemolysis Index: 8 (ref 0–18)

## 2016-05-27 LAB — COMPREHENSIVE METABOLIC PANEL
ALT: 14 U/L (ref 0–55)
AST (SGOT): 15 U/L (ref 5–34)
Albumin/Globulin Ratio: 1.9 (ref 0.9–2.2)
Albumin: 4.1 g/dL (ref 3.5–5.0)
Alkaline Phosphatase: 43 U/L (ref 37–106)
BUN: 7 mg/dL (ref 7.0–19.0)
Bilirubin, Total: 0.8 mg/dL (ref 0.1–1.2)
CO2: 33 mEq/L — ABNORMAL HIGH (ref 21–30)
Calcium: 10 mg/dL (ref 8.5–10.5)
Chloride: 94 mEq/L — ABNORMAL LOW (ref 100–111)
Creatinine: 0.9 mg/dL (ref 0.4–1.5)
Globulin: 2.2 g/dL (ref 2.0–3.7)
Glucose: 88 mg/dL (ref 70–100)
Potassium: 4.3 mEq/L (ref 3.5–5.3)
Protein, Total: 6.3 g/dL (ref 6.0–8.3)
Sodium: 136 mEq/L (ref 135–146)

## 2016-05-27 LAB — CBC AND DIFFERENTIAL
Basophils Absolute Automated: 0.03 10*3/uL (ref 0.00–0.20)
Basophils Automated: 0.4 %
Eosinophils Absolute Automated: 0.22 10*3/uL (ref 0.00–0.70)
Eosinophils Automated: 2.7 %
Hematocrit: 44.7 % (ref 37.0–47.0)
Hgb: 14.1 g/dL (ref 12.0–16.0)
Immature Granulocytes Absolute: 0.02 10*3/uL
Immature Granulocytes: 0.2 %
Lymphocytes Absolute Automated: 2.38 10*3/uL (ref 0.50–4.40)
Lymphocytes Automated: 28.9 %
MCH: 31.4 pg (ref 28.0–32.0)
MCHC: 31.5 g/dL — ABNORMAL LOW (ref 32.0–36.0)
MCV: 99.6 fL (ref 80.0–100.0)
MPV: 9.1 fL — ABNORMAL LOW (ref 9.4–12.3)
Monocytes Absolute Automated: 0.71 10*3/uL (ref 0.00–1.20)
Monocytes: 8.6 %
Neutrophils Absolute: 4.88 10*3/uL (ref 1.80–8.10)
Neutrophils: 59.2 %
Nucleated RBC: 0 /100 WBC (ref 0.0–1.0)
Platelets: 326 10*3/uL (ref 140–400)
RBC: 4.49 10*6/uL (ref 4.20–5.40)
RDW: 13 % (ref 12–15)
WBC: 8.24 10*3/uL (ref 3.50–10.80)

## 2016-05-27 LAB — LIPID PANEL
Cholesterol / HDL Ratio: 2.9
Cholesterol: 200 mg/dL — ABNORMAL HIGH (ref 0–199)
HDL: 68 mg/dL (ref 40–9999)
LDL Calculated: 103 mg/dL — ABNORMAL HIGH (ref 0–99)
Triglycerides: 147 mg/dL (ref 34–149)
VLDL Calculated: 29 mg/dL (ref 10–40)

## 2016-05-27 LAB — T4, FREE: T4 Free: 0.97 ng/dL (ref 0.70–1.48)

## 2016-05-27 LAB — TSH: TSH: 3.45 u[IU]/mL (ref 0.35–4.94)

## 2016-05-27 LAB — GFR: EGFR: >60

## 2016-05-27 MED ORDER — DILTIAZEM HCL ER COATED BEADS 120 MG PO CP24
120.0000 mg | ORAL_CAPSULE | Freq: Every day | ORAL | Status: DC
Start: 2016-05-27 — End: 2016-06-12

## 2016-05-27 NOTE — Progress Notes (Signed)
Katherine Mays is a 65 y.o. female who presents today for a Medicare Annual Wellness Visit.     Health Risk Assessment     During the past month, how would you rate your general health?:  Fair  Which of the following tasks can you do without assistance - drive or take the bus alone; shop for groceries or clothes; prepare your own meals; do your own housework/laundry; handle your own finances/pay bills; eat, bathe or get around your home?:  Drive or take the bus alone, Shop for groceries or clothes, Prepare your own meals, Do your own housework/laundry, Handle your own finances/pay bills, Eat, bathe, dress or get around your home  Which of the following problems have you been bothered by in the past month - dizzy when standing up; problems using the phone; feeling tired or fatigued; moderate or severe body pain?: Dizzy when standing up, Feeling tired or fatigued, Moderate or severe body pain  Do you exercise for about 20 minutes 3 or more days per week?:  Yes  During the past month was someone available to help if you needed and wanted help?  For example, if you felt nervous, lonely, got sick and had to stay in bed, needed someone to talk to, needed help with daily chores or needed help just taking care of yourself.: Yes  Do you always wear a seat belt?: Yes  Do you have any trouble taking medications the way you have been told to take them?: No  Have you been given any information that can help you with keeping track of your medications?: Yes  Do you have trouble paying for your medications?: No  Have you been given any information that can help you with hazards in your house, such as scatter rugs, furniture, etc?: Yes  Do you feel unsteady when standing or walking?: Yes  Do you worry about falling?: No  Have you fallen two or more times in the past year?: No  Did you suffer any injuries from your falls in the past year?: No    Additional Concerns    Patient Care Team:  Clydell Hakim, MD as PCP - General (Internal  Medicine)  Eddie North, MD as Consulting Physician (Nephrology)    Past Medical History   Diagnosis Date   . Anemia    . Osteoporosis, idiopathic    . Depression age 60      followed by Psychiatrist.    . Disorder of thyroid      followed by Endocrinologist.    . Chronic kidney disease, stage I 06/2013     Past Surgical History   Procedure Laterality Date   . Tonsillectomy     . Cesarean section       [age]   . Neck surgery     . Wisdom tooth extraction     . Hysterectomy  1995     Allergies   Allergen Reactions   . Keflex [Cephalexin]       Current Outpatient Prescriptions   Medication Sig Dispense Refill   . dilTIAZem (CARDIZEM CD) 120 MG 24 hr capsule Take 1 capsule (120 mg total) by mouth daily. 90 capsule 2   . hydrOXYzine (ATARAX) 25 MG tablet Take 25 mg by mouth 2 (two) times daily.     Marland Kitchen levothyroxine (SYNTHROID) 75 MCG tablet Take 75 mcg by mouth.     . traMADol (ULTRAM) 50 MG tablet Take 50 mg by mouth every 6 (six) hours as needed for Pain.     Marland Kitchen  venlafaxine (EFFEXOR-XR) 150 MG 24 hr capsule Take 75 mg by mouth daily.        Marland Kitchen zolpidem (AMBIEN CR) 6.25 MG CR tablet Take 6.25 mg by mouth.       No current facility-administered medications for this visit.      Social History   Substance Use Topics   . Smoking status: Never Smoker    . Smokeless tobacco: Never Used   . Alcohol Use: Yes      Comment: 5-6 drinks per week      Family History   Problem Relation Age of Onset   . Thyroid disease Mother    . Diabetes Father    . Heart disease Father 15     M I    . Osteoporosis Father    . Stroke Father         Hospitalizations  no hospitalizations within past 6 months    Depression Screening    See related Activity or Flowsheet    Functional Ability    Falls Risk:  home does not have throw rugs, poor lighting or a slippery bath tub or shower, < or = 1 fall within past year and timed "Get Up and Go Evaluation" < 20 seconds  Hearing:  hearing within normal limits  Exercise:  Light ( i.e. stretching or slow  walking ) and Moderate ( i.e. brisk walking )  ADL's:   Bathing - independent   Dressing - independent   Mobility - independent   Transfer - independent   Eating - independent}   Toileting - independent   ADL assistance not needed    Discussion of Advance Directives: Has no Advanced Directive. Form provided.     Assessment    BP 122/82 mmHg  Pulse 83  Temp(Src) 97.4 F (36.3 C) (Oral)  Resp 12  Ht 1.6 m (5\' 3" )  Wt 50.803 kg (112 lb)  BMI 19.84 kg/m2     Physical Examination: General appearance - alert, well appearing, and in no distress  Mental status - alert, oriented to person, place, and time  Chest - clear to auscultation, no wheezes, rales or rhonchi, symmetric air entry  Heart - normal rate, regular rhythm, normal S1, S2, no murmurs, rubs, clicks or gallops  Abdomen - soft, nontender, nondistended, no masses or organomegaly  Neurological - alert, oriented, normal speech, no focal findings or movement disorder noted  Extremities - peripheral pulses normal, no pedal edema, no clubbing or cyanosis    Vision Screening (required for IPPE only): Patient states eye exam performed elsewhere within past 12 months  Screening EKG (IPPE only): not indicated    Evaluation of Cognitive Function    Mood/affect: Appropriate  Appearance: neatly groomed, appropriately and adequately nourished  Family member/caregiver input: Not present    Mini-Cog    Step 1: Three word registration  Version 1: Banana; Sunrise; Chair  Version 2: Leader; Season; Table    Step 2: Clock drawing    Step 3: Three word recall    Scoring:  - word recall: 0-3 points (1 point for each)  - clock draw: 0 or 2 points (normal clock = 2 points)  Total: 0-5 points (< 4 points may indicate need for further evaluation)    Result:  > 3 points - negative screen for dementia    Assessment/Plan    1. Medicare annual wellness visit, initial    2. Essential hypertension, benign  - dilTIAZem (CARDIZEM CD) 120 MG 24 hr capsule; Take  1 capsule (120 mg total) by  mouth daily.  Dispense: 90 capsule; Refill: 2  - CBC and differential  - Comprehensive metabolic panel    3. CKD (chronic kidney disease), unspecified stage  - CBC and differential  - Comprehensive metabolic panel    4. Hypothyroidism, unspecified type  - Lipid panel  - TSH  - T4, free    5. Pure hypercholesterolemia  - Lipid panel      Patient's claims that she had pneumonia vaccine during fall of  2016 given by family practitioner who also need gynecological examination.  We called that physician's office and we were told that she did not get pneumonia vaccine.  Patient is very sure about it.  She will double check it and let me know later on.  She was informed that there are 2 kinds of pneumonia vaccine, which should be taken one year apart.        Clydell Hakim, MD          Preventive Service (Medicare Frequency)   USPSTF Frequency   ICD-10     Body Mass Index     Annually The USPSTF recommends screening all adults for obesity. Clinicians should offer or refer patients with a body mass index of 30 kg/m2 or higher to intensive, multicomponent behavioral interventions.      Blood Pressure:   Annually The USPSTF recommends screening for high blood pressure in adults aged 18 years or older. The USPSTF recommends obtaining measurements outside of the clinical setting for diagnostic confirmation before starting treatment.      Vision Screening   Every 1-2 years     No current recommendations      Cholesterol Testing   Once every 5 years    The USPSTF strongly recommends screening men age 62 years and older for lipid disorders.      Z13,6 Screening for cardiovascular disorders     Diabetes Screening   Two screening tests per year for Medicare beneficiaries diagnosed with pre-diabetes   One screening per year if previously tested but not diagnosed with pre-diabetes or if never tested The USPSTF recommends screening for abnormal blood glucose as part of cardiovascular risk assessment in adults aged 80 to 70 years  who are overweight or obese. Clinicians should offer or refer patients with abnormal blood glucose to intensive behavioral counseling interventions to promote a healthful diet and physical activity.        Z13.1 Screening for diabetes mellitus   R73.09 may be reported as a secondary code to indicate prediabetes   Osteoporosis Screening   (Bone Density Measurement)    Every 2 years   Who is covered: Women determined by their physician or qualified non-physician practitioner (NPP) to be estrogen deficient and at clinical risk for osteoporosis; Individuals with vertebral abnormalities; Individuals getting (or expecting to get) glucocorticoid therapy for more than 3 months; Individuals with primary hyperparathyroidism; Individuals being monitored to assess response to U.S. Food and Drug Administration (FDA)-approved osteoporosis drug therapy  The USPSTF recommends screening for osteoporosis in women age 41 years and older and in younger women whose fracture risk is equal to or greater than that of a 66 year old white woman who has no additional risk factors.          Z87.0 Asymptomatic menopausal state   Z79.83 Long-term (current) use of bisphosphonates   E21.0 Primary hyperparathyroidism   E21.3 Hyperparathyroidism, unspec.   Also covered for vertebral fracture   Breast Cancer Screening (Mammogram)  Aged 72 through 7: one baseline; aged 50 and older: annually   Who is covered: All female Medicare beneficiaries aged 55 and older  The USPSTF recommends screening mammography for women, with or without clinical breast examination, every 1 to 2 years for women age 63 years and older.      Z12.31 Screening mammogram for malignant neoplasm of breast   Cervical Cancer Screening (Pap Smear)    Annually if at high risk for developing cervical or vaginal cancer or childbearing age with abnormal Pap test within past 3 years; Every 2 years for women at normal risk   HPV: Once every 5 years; All asymptomatic female  Medicare beneficiaries aged 66 to 82 years                 Age 4-65: Perform cytologic and HPV cotesting every 5 years (preferred), or perform cytology testing alone every 3 years (acceptable); Age 53+: Discontinue screening if there has been an adequate number of negative screening results previously (3 consecutive negative cytologic tests or 2 consecutive negative cotests in the past 10 years, with the most recent test in the past 5 years) and if there is no history of HSIL, adenocarcinoma in situ, or cancer)  Z01.411 Gynecological exam with abnormal findings; also code abnormal findings   Z01.419 without abnormal findings    For acquired absence of cervix or uterus:   Z12.72 Screening malignant neoplasm vagina and Z90.710 Acquired absence of cervix and uterus, Z90.711 Acquired absence of uterus with remaining cervical stump, or Z90.712 acquired absence of cervix w/ remaining uterus    For high risk:   Z72.51-Z72.53 High-risk heterosexual, homosexual, or bisexual behavior   Z72.89 Other problems related to lifestyle   Z77.9 Other contact w/ and (suspected) exposures hazardous to health   Z91.89 Other personal risk factors, NEC   Z92.89 Personal Hx of other medical treatment    For combined Pap smear/HPV screening:   Z11.51 Screening for HPV and Z01.411 or Z01.419 (noted above)       Colorectal Cancer Screening   Screening Fecal Occult Blood Test (FOBT) - every year - G0328   Cologuard Multitarget Stool DNA (sDNA) Test - once every 3 years   Screening flexible sigmoidoscopy - once every 4 years (unless a screening colonoscopy has been performed and then Medicare may cover a screening flexible sigmoidoscopy only after at least 119 months)   Screening colonoscopy - every 10 years (unless a screening flexible sigmoidoscopy has been performed and then Medicare may cover a screening colonoscopy only after 47 months)   Screening barium exema (as an alternative to covered screening flexible sigmoidoscopy)    The USPSTF recommends screening for colorectal cancer starting at age 64 years and continuing until age 41 years.      Depression Screening   Frequency: annually The USPSTF recommends screening for depression in the general adult population, including pregnant and postpartum women. Screening should be implemented with adequate systems in place to ensure accurate diagnosis, effective treatment, and appropriate follow-up.      Sexually Transmitted Diseases (STDs) & HIV Screening   Annually for Medicare beneficiaries between the ages of 11 and 69 without regard to perceived risk   Annually for Medicare beneficiaries younger than 50 and adults older than 76 who are at increased risk for HIV infection   For Medicare beneficiaries who are pregnant, 3 times per pregnancy: first, when a woman is diagnosed with pregnancy; second, during the third trimester; third, at labor, if ordered by  a woman's clinician         The USPSTF recommends that clinicians screen for HIV infection in adolescents and adults ages 56 to 10 years. Younger adolescents and older adults who are at increased risk should also be screened.          Z11.4 Screening for HIV    And if high risk:   Z72.51-Z72.53 High-risk heterosexual, homosexual, or bisexual behavior   Z72.89 Other problems related to lifestyle     Alcohol Misuse Screening   G0442 - annual alcohol misuse screening, 15 mins - annually   [phone] - brief face-to-face behavioral counseling for alcohol misuse, 15 mins - for those who screen positive, 4 times per year The USPSTF recommends that clinicians screen adults age 66 years or older for alcohol misuse and provide persons engaged in risky or hazardous drinking with brief behavioral counseling interventions to reduce alcohol misuse.      Immunizations:  Prevnar 13  Pneumococcal 23  Influenza  Hepatitis B    (Prevnar 13: 1 dose at age 31+  Pneumovax 69: 1 dose one year after Prevnar 13  Influenza: Annually  Hepatitis B: One series of  3 injections for those at risk.)           No current recommendations      Advance Directive   Once; update as needed         Medical Nutrition Therapy   As necessary for diabetes or renal disease         Smoking Cessation Counseling   [phone] - smoking and tobacco-use cessation counseling visit; intermediate, greater than 3 minutes up to 10 minutes   99407 - smoking and tobacco-use cessation counseling visit; intensive, greater than 10 minutes   Frequency: two cessation attempts per year. Each attempt may include a maximum of 4 intermediate or intensive sessions, with the total annual benefit covering up to 8 sessions per year.   The USPSTF recommends that clinicians ask all adults about tobacco use, advise them to stop using tobacco, and provide behavioral interventions and U.S. Food and Drug Administration (FDA)-approved pharmacotherapy for cessation to adults who use tobacco.        F17.2- Nicotine dependence   Z87.891 Personal Hx of nicotine dependence, unspec., uncomplicated   Glaucoma Screening  . Z13.5, annually for covered Medicare beneficiaries No current recommendations    Hepatitis C Virus (HCV) Screening   Z72.89 and F19.20   H4742 - Hepatitis C antibody screening, for individual at high risk and other covered indication(s)   Who is covered: high risk for HCV infection, or born between [birth year] and 1965   Annually only for high risk Medicare beneficiaries with continued illicit injection drug use since the prior negative screening test   Once in a lifetime for Medicare beneficiaries born between [birth year] and 1965 who are not considered high risk The USPSTF recommends screening for hepatitis C virus (HCV) infection in persons at high risk for infection. The USPSTF also recommends offering one-time screening for HCV infection to adults born between [birth year] and 1965.        Z72.89 Other problems related to lifestyle    And if applicable:   F19.20 Other psychoactive substance dependence, uncomplicated    Lung Cancer Screening   G0296 - counseling visit to discuss need for lung cancer screening (LDCT) using low dose CT scan (service is eligibility determination and shared decision making)   Who is covered: asymptomatic, tobacco smoking history of at least 30 pack-years (one  pack-year = smoking one pack per day for one year; 1 pack = 20 cigarettes), and current smoker or one who has quick smoking within the last 15 years   Frequency: annually for covered Medicare beneficiaries. First year: before the first lung cancer LDCT screening, Medicare beneficiaries must receive a counseling and shared decision making visit. Subsequent years: the Medicare beneficiary must receive a written order furnished during an appropriate visit with a physician or NPP The USPSTF recommends annual screening for lung cancer with low-dose computed tomography in adults ages 30 to 54 years who have a 30 pack-year smoking history and currently smoke or have quit within the past 15 years. Screening should be discontinued once a person has not smoked for 15 years or develops a health problem that substantially limits life expectancy or the ability or willingness to have curative lung surgery.      R60.454 Personal Hx of nicotine dependence      References:  CMS Medicare Preventive Services ICN [phone] October 2016  [place] Preventive Services Task Force A and B Recommendations June 2016

## 2016-05-27 NOTE — Telephone Encounter (Signed)
Documentation only.   Called to remind to schedule medicare physical.

## 2016-05-27 NOTE — Patient Instructions (Signed)
Women's Preventive Wellness Plan  Today's Date: May 27, 2016    Patient Name:Katherine Mays    Date of Birth: [birth year]     As part of your wellness benefit, Medicare makes many screening tests available to you at no charge.  A complete list of these tests can be found at their website, [email or web address]. However, many of these tests or recommendations are out of date, or may not apply to you. After careful consideration of your own personal health needs, the following testing is recommended for you:      Preventive Service (Medicare Frequency)   Last Done   Up-to-date (UTD)/Due/Not Applicable (N/A)     Body Mass Index     Annually   May 27, 2016  (BMI):Body mass index is 19.84 kg/(m^2).   Height:Height: 160 cm (5\' 3" )  Weight:Weight: 50.803 kg (112 lb)        Up-to-date     Blood Pressure:   Annually   May 27, 2016    BP: 122/82 mmHg     Up-to-date     Vision Screening   Every 1-2 years      Due     Cholesterol Testing   once every 5 years   No results found for: LDL    Due     Diabetes Screening   Two screening tests per year for Medicare beneficiaries diagnosed with pre-diabetes   One screening per year if previously tested but not diagnosed with pre-diabetes or if never tested   Lab Results   Component Value Date    GLU 78 07/07/2015              Up-to-date     Osteoporosis Screening   (Bone Density Measurement)    Every 2 years   Who is covered: Women determined by their physician or qualified non-physician practitioner (NPP) to be estrogen deficient and at clinical risk for osteoporosis; Individuals with vertebral abnormalities; Individuals getting (or expecting to get) glucocorticoid therapy for more than 3 months; Individuals with primary hyperparathyroidism; Individuals being monitored to assess response to U.S. Food and Drug Administration (FDA)-approved osteoporosis drug therapy                Up-to-date     Breast Cancer Screening    (Mammogram)   Aged 46 through 51: one baseline; aged 71 and older: annually   Who is covered: All female Medicare beneficiaries aged 37 and older        Up-to-date   Cervical Cancer Screening   (Pap Smear)    Annually if at high risk for developing cervical or vaginal cancer or childbearing age with abnormal Pap test within past 3 years; Every 2 years for women at normal risk   HPV: Once every 5 years; All asymptomatic female Medicare beneficiaries aged 7 to 75 years                            Up-to-date   Colorectal Cancer Screening   Cologuard Multitarget Stool DNA (sDNA) Test - once every 3 years   Screening Fecal Occult Blood Test (FOBT) - every year - G0328   Screening flexible sigmoidoscopy - once every 4 years (unless a screening colonoscopy has been performed and then Medicare may cover a screening flexible sigmoidoscopy only after at least 119 months)   Screening colonoscopy - every 10 years (unless a screening flexible sigmoidoscopy has been performed and then Medicare may cover a screening colonoscopy  only after 47 months)   Screening barium exema (as an alternative to covered screening flexible sigmoidoscopy)              Up-to-date     Depression Screening   Frequency: annually May 27, 2016    Up-to-date     Sexually Transmitted Diseases (STDs) & HIV Screening   Annually for Medicare beneficiaries between the ages of 59 and 2 without regard to perceived risk   Annually for Medicare beneficiaries younger than 24 and adults older than 26 who are at increased risk for HIV infection   For Medicare beneficiaries who are pregnant, 3 times per pregnancy: first, when a woman is diagnosed with pregnancy; second, during the third trimester; third, at labor, if ordered by a woman's clinician              Not Applicable     Alcohol Misuse Screening   annual alcohol misuse screening, 15 mins - annually   brief face-to-face behavioral counseling for alcohol misuse, 15 mins - for those who screen  positive, 4 times per year      Not Applicable     Immunizations:  Prevnar 13  Pneumococcal 23  Influenza  Hepatitis B    (Prevnar 13: 1 dose at age 16+  Pneumovax 40: 1 dose one year after Prevnar 13  Influenza: Annually  Hepatitis B: One series of 3 injections for those at risk.)   Immunization History   Administered Date(s) Administered   . Influenza (Im) Preserved TRIVALENT VACCINE 10/01/2013   . Influenza quadrivalent (IM) 3 Yrs & greater 10/12/2014   . Tdap 10/01/2013   . Zoster 10/01/2013                Up-to-date     Advance Directive   Once; update as needed  Due     Medical Nutrition Therapy   As necessary for diabetes or renal disease    Not Applicable     Smoking Cessation Counseling   Frequency: two cessation attempts per year. Each attempt may include a maximum of 4 intermediate or intensive sessions, with the total annual benefit covering up to 8 sessions per year.   Counseling given: Not Answered             Not Applicable   Glaucoma Screening  . Annually for covered Medicare beneficiaries    Due   Hepatitis C Virus (HCV) Screening   Who is covered: high risk for HCV infection, or born between between 1945 and 1965   Annually only for high risk Medicare beneficiaries with continued illicit injection drug use since the prior negative screening test   Once in a lifetime for Medicare beneficiaries born between [birth year] and 1965 who are not considered high risk          Due   Lung Cancer Screening   Who is covered: asymptomatic, tobacco smoking history of at least 30 pack-years (one pack-year = smoking one pack per day for one year; 1 pack = 20 cigarettes), and current smoker or one who has quick smoking within the last 15 years   Frequency: annually for covered Medicare beneficiaries. First year: before the first lung cancer LDCT screening, Medicare beneficiaries must receive a counseling and shared decision making visit. Subsequent years: the Medicare beneficiary must receive a written order  furnished during an appropriate visit with a physician or NPP            Not Applicable     Your  major risk factors:      Hypertension     Recommendations for improvement:   Low cholesterol diet, Exercise, Low carb diet and Weight bearing exercises     Referrals:   See After Visit Summary orders           Home Safety Tips    - Secure all electrical cords out of the way so you do not trip over them.  - Get rid of or secure all loose rugs that you could trip over.  - Avoid clutter and low furniture on the floor, especially in major walkways.  - Keep your home well-lighted so you can easily see where you're going.  - Place a lamp near the bed so it is easy to reach.  - Avoid storing things in high places so you don't have to reach or climb.  - If you need a step stool, make sure it is a sturdy one with a bar to hold on to. Never use a chair.  - Wear sturdy shoes that fit well. Avoid high heels, slippery soles and shoes that are too  loose.   - Avoid walking around in socks or with bare feet.  - Make sure all shower/bath tub floors have a non-slip mat.  - If needed, install grab bars and/or safety seats in the shower/bath tub.  - Make sure all stairs have handrails and a non-slip surface.  - Make sure all stairs have a working light above them.  - Consider an alarm button that you can wear in case you fall and cannot reach the telephone.  - Always wear a seatbelt while driving a vehicle.

## 2016-05-27 NOTE — Progress Notes (Signed)
Have you seen any specialists/other providers since your last visit with us?    No      Limb alert protocol reviewed?      Yes

## 2016-06-12 ENCOUNTER — Other Ambulatory Visit (INDEPENDENT_AMBULATORY_CARE_PROVIDER_SITE_OTHER): Payer: Self-pay

## 2016-06-12 DIAGNOSIS — I10 Essential (primary) hypertension: Secondary | ICD-10-CM

## 2016-06-12 MED ORDER — DILTIAZEM HCL ER COATED BEADS 120 MG PO CP24
120.0000 mg | ORAL_CAPSULE | Freq: Every day | ORAL | Status: DC
Start: 2016-06-12 — End: 2017-02-24

## 2016-09-26 ENCOUNTER — Telehealth (INDEPENDENT_AMBULATORY_CARE_PROVIDER_SITE_OTHER): Payer: Self-pay | Admitting: Internal Medicine

## 2016-09-26 ENCOUNTER — Ambulatory Visit (INDEPENDENT_AMBULATORY_CARE_PROVIDER_SITE_OTHER): Payer: Medicare Other | Admitting: Internal Medicine

## 2016-09-26 ENCOUNTER — Encounter (INDEPENDENT_AMBULATORY_CARE_PROVIDER_SITE_OTHER): Payer: Self-pay | Admitting: Internal Medicine

## 2016-09-26 ENCOUNTER — Encounter (INDEPENDENT_AMBULATORY_CARE_PROVIDER_SITE_OTHER): Payer: Self-pay

## 2016-09-26 VITALS — BP 118/76 | HR 89 | Temp 98.3°F | Resp 12 | Ht 63.0 in | Wt 110.0 lb

## 2016-09-26 DIAGNOSIS — N181 Chronic kidney disease, stage 1: Secondary | ICD-10-CM

## 2016-09-26 DIAGNOSIS — E7800 Pure hypercholesterolemia, unspecified: Secondary | ICD-10-CM

## 2016-09-26 DIAGNOSIS — I10 Essential (primary) hypertension: Secondary | ICD-10-CM

## 2016-09-26 DIAGNOSIS — E039 Hypothyroidism, unspecified: Secondary | ICD-10-CM

## 2016-09-26 LAB — COMPREHENSIVE METABOLIC PANEL
ALT: 11 U/L (ref 0–55)
AST (SGOT): 18 U/L (ref 5–34)
Albumin/Globulin Ratio: 2 (ref 0.9–2.2)
Albumin: 4.2 g/dL (ref 3.5–5.0)
Alkaline Phosphatase: 38 U/L (ref 37–106)
BUN: 13 mg/dL (ref 7.0–19.0)
Bilirubin, Total: 0.8 mg/dL (ref 0.1–1.2)
CO2: 30 mEq/L (ref 21–30)
Calcium: 9.3 mg/dL (ref 8.5–10.5)
Chloride: 97 mEq/L — ABNORMAL LOW (ref 100–111)
Creatinine: 0.9 mg/dL (ref 0.4–1.5)
Globulin: 2.1 g/dL (ref 2.0–3.7)
Glucose: 93 mg/dL (ref 70–100)
Potassium: 3.8 mEq/L (ref 3.5–5.1)
Protein, Total: 6.3 g/dL (ref 6.0–8.3)
Sodium: 135 mEq/L (ref 135–146)

## 2016-09-26 LAB — HEMOLYSIS INDEX: Hemolysis Index: 8 (ref 0–18)

## 2016-09-26 LAB — CBC AND DIFFERENTIAL
Absolute NRBC: 0 10*3/uL
Basophils Absolute Automated: 0.04 10*3/uL (ref 0.00–0.20)
Basophils Automated: 0.7 %
Eosinophils Absolute Automated: 0.29 10*3/uL (ref 0.00–0.70)
Eosinophils Automated: 5.1 %
Hematocrit: 43.2 % (ref 37.0–47.0)
Hgb: 14.1 g/dL (ref 12.0–16.0)
Immature Granulocytes Absolute: 0.02 10*3/uL
Immature Granulocytes: 0.3 %
Lymphocytes Absolute Automated: 1.41 10*3/uL (ref 0.50–4.40)
Lymphocytes Automated: 24.7 %
MCH: 32.2 pg — ABNORMAL HIGH (ref 28.0–32.0)
MCHC: 32.6 g/dL (ref 32.0–36.0)
MCV: 98.6 fL (ref 80.0–100.0)
MPV: 9.1 fL — ABNORMAL LOW (ref 9.4–12.3)
Monocytes Absolute Automated: 0.6 10*3/uL (ref 0.00–1.20)
Monocytes: 10.5 %
Neutrophils Absolute: 3.36 10*3/uL (ref 1.80–8.10)
Neutrophils: 58.7 %
Nucleated RBC: 0 /100 WBC (ref 0.0–1.0)
Platelets: 291 10*3/uL (ref 140–400)
RBC: 4.38 10*6/uL (ref 4.20–5.40)
RDW: 13 % (ref 12–15)
WBC: 5.72 10*3/uL (ref 3.50–10.80)

## 2016-09-26 LAB — LIPID PANEL
Cholesterol / HDL Ratio: 2.7
Cholesterol: 232 mg/dL — ABNORMAL HIGH (ref 0–199)
HDL: 87 mg/dL (ref 40–9999)
LDL Calculated: 130 mg/dL — ABNORMAL HIGH (ref 0–99)
Triglycerides: 74 mg/dL (ref 34–149)
VLDL Calculated: 15 mg/dL (ref 10–40)

## 2016-09-26 LAB — GFR: EGFR: >60

## 2016-09-26 LAB — T4, FREE: T4 Free: 0.9 ng/dL (ref 0.70–1.48)

## 2016-09-26 LAB — TSH: TSH: 2.02 u[IU]/mL (ref 0.35–4.94)

## 2016-09-26 NOTE — Progress Notes (Signed)
Have you seen any specialists/other providers since your last visit with us?    No      Limb alert protocol reviewed?      Yes

## 2016-09-26 NOTE — Telephone Encounter (Signed)
noted 

## 2016-09-26 NOTE — Progress Notes (Signed)
[facility] PRIMARY CARE  [place]  PROGRESS NOTE      Patient: Katherine Mays   Date: 09/26/2016   MRN: [id number]     ASSESSMENT/PLAN     Katherine Mays is a 65 y.o. female    Chief Complaint   Patient presents with   . Hypertension   . Hyperlipidemia   . Hypothyroidism        1. Essential hypertension, benign  Controlled.  The risks and complications of high blood pressure have been explained. Importance of low salt diet, and weight control have been explained.    Continue Cardizem CD 120 mg once a day  - CBC and differential  - Comprehensive metabolic panel    2. Hypothyroidism, unspecified type  Currently on Synthroid 75 g daily.  Following labs are ordered to check current status.  - TSH  - T4, free    3. Pure hypercholesterolemia  Diet controlled.    The risks and complications of dyslipidemia have been explained. Importance of low fat diet, and aerobic exercises have been explained.      - Lipid panel    4. Stage 1 chronic kidney disease  Encourage for oral fluids.  Continue Cardizem.  Blood pressure is well controlled.  Avoid nonsteroidal anti-inflammatory medications.  Labs as follow  - CBC and differential  - Comprehensive metabolic panel           MEDICATIONS     Current Outpatient Prescriptions   Medication Sig Dispense Refill   . dilTIAZem (CARDIZEM CD) 120 MG 24 hr capsule Take 1 capsule (120 mg total) by mouth daily. 90 capsule 1   . hydrOXYzine (ATARAX) 25 MG tablet Take 25 mg by mouth 2 (two) times daily.     Marland Kitchen levothyroxine (SYNTHROID) 75 MCG tablet Take 75 mcg by mouth.     . traMADol (ULTRAM) 50 MG tablet Take 50 mg by mouth every 6 (six) hours as needed for Pain.     . venlafaxine (EFFEXOR-XR) 150 MG 24 hr capsule Take 75 mg by mouth daily.        Marland Kitchen zolpidem (AMBIEN CR) 6.25 MG CR tablet Take 6.25 mg by mouth.     . PROLIA 60 MG/ML Solution subcutaneous injection        No current facility-administered medications for this visit.        Allergies   Allergen Reactions   . Keflex [Cephalexin]         SUBJECTIVE     Chief Complaint   Patient presents with   . Hypertension   . Hyperlipidemia   . Hypothyroidism        Hypertension   This is a chronic problem. The current episode started more than 1 year ago. The problem is unchanged. The problem is controlled. Pertinent negatives include no PND or shortness of breath. There are no associated agents to hypertension. Risk factors for coronary artery disease include dyslipidemia and post-menopausal state. Past treatments include calcium channel blockers. The current treatment provides significant improvement. There are no compliance problems.  Hypertensive end-organ damage includes kidney disease. There is no history of angina or CAD/MI. Identifiable causes of hypertension include chronic renal disease.   Hyperlipidemia   This is a chronic problem. The current episode started more than 1 year ago. The problem is controlled. Exacerbating diseases include chronic renal disease. Pertinent negatives include no shortness of breath. Current antihyperlipidemic treatment includes exercise and diet change. The current treatment provides significant improvement of lipids. Risk factors for coronary  artery disease include dyslipidemia, post-menopausal and hypertension.   Hypothyroidism   Presents for initial visit. Symptoms include fatigue. Patient reports no dry skin, leg swelling, weight gain or weight loss. The symptoms have been stable. Past treatments include levothyroxine. Her past medical history is significant for hyperlipidemia. There are no known risk factors.           ROS     Review of Systems   Constitutional: Positive for fatigue. Negative for unexpected weight change, weight gain and weight loss.   Respiratory: Negative for shortness of breath and wheezing.    Cardiovascular: Negative for PND.   Musculoskeletal: Negative.    All other systems reviewed and are negative.          The following portions of the patient's history were reviewed and updated as  appropriate: Allergies, Current Medications, Past Family History, Past Medical history, Past social history, Past surgical history, and Problem List.      PHYSICAL EXAM     Vitals:    09/26/16 0811 09/26/16 0821   BP: 132/84 118/76   BP Site: Left arm Right arm   Patient Position: Sitting Sitting   Cuff Size: Medium Medium   Pulse: 89    Resp: 12    Temp: 98.3 F (36.8 C)    TempSrc: Oral    SpO2: 98%    Weight: 49.9 kg (110 lb)    Height: 1.6 m (5\' 3" )          Physical Exam   Constitutional: She is oriented to person, place, and time. She appears well-developed and well-nourished.   HENT:   Mouth/Throat: Oropharynx is clear and moist.   Eyes: Conjunctivae and EOM are normal. Pupils are equal, round, and reactive to light.   Neck: Normal range of motion. Neck supple. No thyromegaly present.   Cardiovascular: Normal rate, regular rhythm, normal heart sounds and intact distal pulses.    Pulmonary/Chest: Effort normal and breath sounds normal. No respiratory distress. She has no wheezes. She has no rales.   Abdominal: Soft. Bowel sounds are normal. She exhibits no distension. There is no rebound.   Musculoskeletal: Normal range of motion. She exhibits no edema or tenderness.   Lymphadenopathy:     She has no cervical adenopathy.   Neurological: She is alert and oriented to person, place, and time. She has normal reflexes. No cranial nerve deficit. Coordination normal.   Skin: Skin is warm and dry. No erythema.   Psychiatric: She has a normal mood and affect. Her behavior is normal.   Vitals reviewed.            Signed,  Clydell Hakim, MD  09/26/2016

## 2016-09-26 NOTE — Telephone Encounter (Signed)
Pt also requested phone call for her results and send her via mail results for today's visit

## 2016-09-26 NOTE — Telephone Encounter (Signed)
Pt request that her labs results for today's labs will be send to the following providers. (she does  not have any   Fax number for the providers)  Dr. Spero Geralds   Phone  [phone]  Dr. Trixie Deis Phone [phone]  Dr. Hulen Shouts   Phone [phone]    Any questions please call her [phone]

## 2016-10-30 ENCOUNTER — Encounter (INDEPENDENT_AMBULATORY_CARE_PROVIDER_SITE_OTHER): Payer: Self-pay

## 2017-02-24 ENCOUNTER — Ambulatory Visit (FREE_STANDING_LABORATORY_FACILITY): Payer: Medicare Other | Admitting: Internal Medicine

## 2017-02-24 ENCOUNTER — Encounter (INDEPENDENT_AMBULATORY_CARE_PROVIDER_SITE_OTHER): Payer: Self-pay | Admitting: Internal Medicine

## 2017-02-24 ENCOUNTER — Other Ambulatory Visit (INDEPENDENT_AMBULATORY_CARE_PROVIDER_SITE_OTHER): Payer: Self-pay

## 2017-02-24 VITALS — BP 127/81 | HR 76 | Temp 97.1°F | Resp 12 | Ht 62.0 in | Wt 109.0 lb

## 2017-02-24 DIAGNOSIS — I10 Essential (primary) hypertension: Secondary | ICD-10-CM

## 2017-02-24 DIAGNOSIS — R51 Headache: Secondary | ICD-10-CM

## 2017-02-24 DIAGNOSIS — G8929 Other chronic pain: Secondary | ICD-10-CM

## 2017-02-24 LAB — CBC AND DIFFERENTIAL
Absolute NRBC: 0 10*3/uL
Basophils Absolute Automated: 0.04 10*3/uL (ref 0.00–0.20)
Basophils Automated: 1 %
Eosinophils Absolute Automated: 0.2 10*3/uL (ref 0.00–0.70)
Eosinophils Automated: 4.9 %
Hematocrit: 43.3 % (ref 37.0–47.0)
Hgb: 13.7 g/dL (ref 12.0–16.0)
Immature Granulocytes Absolute: 0.01 10*3/uL
Immature Granulocytes: 0.2 %
Lymphocytes Absolute Automated: 1.44 10*3/uL (ref 0.50–4.40)
Lymphocytes Automated: 35.2 %
MCH: 31.5 pg (ref 28.0–32.0)
MCHC: 31.6 g/dL — ABNORMAL LOW (ref 32.0–36.0)
MCV: 99.5 fL (ref 80.0–100.0)
MPV: 9 fL — ABNORMAL LOW (ref 9.4–12.3)
Monocytes Absolute Automated: 0.43 10*3/uL (ref 0.00–1.20)
Monocytes: 10.5 %
Neutrophils Absolute: 1.97 10*3/uL (ref 1.80–8.10)
Neutrophils: 48.2 %
Nucleated RBC: 0 /100 WBC (ref 0.0–1.0)
Platelets: 282 10*3/uL (ref 140–400)
RBC: 4.35 10*6/uL (ref 4.20–5.40)
RDW: 13 % (ref 12–15)
WBC: 4.09 10*3/uL (ref 3.50–10.80)

## 2017-02-24 LAB — COMPREHENSIVE METABOLIC PANEL
ALT: 10 U/L (ref 0–55)
AST (SGOT): 16 U/L (ref 5–34)
Albumin/Globulin Ratio: 2 (ref 0.9–2.2)
Albumin: 4.1 g/dL (ref 3.5–5.0)
Alkaline Phosphatase: 39 U/L (ref 37–106)
BUN: 9 mg/dL (ref 7.0–19.0)
Bilirubin, Total: 0.6 mg/dL (ref 0.1–1.2)
CO2: 33 mEq/L — ABNORMAL HIGH (ref 21–29)
Calcium: 9.7 mg/dL (ref 8.5–10.5)
Chloride: 97 mEq/L — ABNORMAL LOW (ref 100–111)
Creatinine: 0.9 mg/dL (ref 0.4–1.5)
Globulin: 2.1 g/dL (ref 2.0–3.7)
Glucose: 88 mg/dL (ref 70–100)
Potassium: 4.6 mEq/L (ref 3.5–5.1)
Protein, Total: 6.2 g/dL (ref 6.0–8.3)
Sodium: 139 mEq/L (ref 136–145)

## 2017-02-24 LAB — HEMOLYSIS INDEX: Hemolysis Index: 4 (ref 0–18)

## 2017-02-24 LAB — GFR: EGFR: >60

## 2017-02-24 MED ORDER — DILTIAZEM HCL ER COATED BEADS 120 MG PO CP24
120.0000 mg | ORAL_CAPSULE | Freq: Every day | ORAL | 0 refills | Status: DC
Start: 2017-02-24 — End: 2018-03-13

## 2017-02-24 MED ORDER — DILTIAZEM HCL ER COATED BEADS 120 MG PO CP24
120.0000 mg | ORAL_CAPSULE | Freq: Every day | ORAL | 3 refills | Status: DC
Start: 2017-02-24 — End: 2018-01-29

## 2017-02-24 NOTE — Progress Notes (Signed)
Have you seen any specialists/other providers since your last visit with us?    No      Limb alert protocol reviewed?      Yes

## 2017-02-24 NOTE — Progress Notes (Signed)
[facility] PRIMARY CARE  [place]  PROGRESS NOTE      Patient: Katherine Mays   Date: 02/24/2017   MRN: [id number]     ASSESSMENT/PLAN     Katherine Mays is a 66 y.o. female    Chief Complaint   Patient presents with   . Hypertension   . Headache        1. Essential hypertension, benign  Controlled  The risks and complications of high blood pressure have been explained. Importance of low salt diet, and weight control have been explained.    Labs and refill as follow  - dilTIAZem (CARDIZEM CD) 120 MG 24 hr capsule; Take 1 capsule (120 mg total) by mouth daily.  Dispense: 90 capsule; Refill: 3  - dilTIAZem (CARDIZEM CD) 120 MG 24 hr capsule; Take 1 capsule (120 mg total) by mouth daily.  Dispense: 30 capsule; Refill: 0  - Comprehensive metabolic panel  - CBC and differential    2. Chronic nonintractable headache, unspecified headache type  Patient is advised to see a neurologist.  Referral is given.    - Comprehensive metabolic panel  - CBC and differential  - Neurology Referral: Eleanora Neighbor. Stacy Gardner, MD [facility])           MEDICATIONS     Current Outpatient Prescriptions   Medication Sig Dispense Refill   . dilTIAZem (CARDIZEM CD) 120 MG 24 hr capsule Take 1 capsule (120 mg total) by mouth daily. 90 capsule 3   . dilTIAZem (CARDIZEM CD) 120 MG 24 hr capsule Take 1 capsule (120 mg total) by mouth daily. 30 capsule 0   . HYDROcodone-acetaminophen (NORCO) 5-325 MG per tablet TAKE 1 TABLET EVERY 6 HOURS AS NEEDED  0   . hydrOXYzine (ATARAX) 25 MG tablet Take 25 mg by mouth 2 (two) times daily.     Marland Kitchen levothyroxine (SYNTHROID) 75 MCG tablet Take 75 mcg by mouth.     . PROLIA 60 MG/ML Solution subcutaneous injection      . traMADol (ULTRAM) 50 MG tablet Take 50 mg by mouth every 6 (six) hours as needed for Pain.     . venlafaxine (EFFEXOR-XR) 150 MG 24 hr capsule Take 75 mg by mouth daily.        Marland Kitchen zolpidem (AMBIEN CR) 6.25 MG CR tablet Take 6.25 mg by mouth.       No current facility-administered medications for this visit.         Allergies   Allergen Reactions   . Keflex [Cephalexin]        SUBJECTIVE     Chief Complaint   Patient presents with   . Hypertension   . Headache        Hypertension   This is a chronic problem. The current episode started more than 1 year ago. The problem is unchanged. The problem is controlled. Associated symptoms include headaches. Pertinent negatives include no chest pain, peripheral edema, PND or shortness of breath. There are no associated agents to hypertension. Risk factors for coronary artery disease include post-menopausal state. Past treatments include calcium channel blockers. The current treatment provides significant improvement. There are no compliance problems.  There is no history of angina.   Headache    This is a recurrent problem. The current episode started more than 1 year ago. The problem occurs daily. The problem has been waxing and waning. The pain is located in the bilateral (diffuse ) region. The pain radiates to the upper back. The quality of the pain is  described as aching and dull. The pain is at a severity of 5/10. The pain is moderate. Pertinent negatives include no dizziness, fever, numbness, phonophobia, photophobia, sinus pressure or weakness. Nothing aggravates the symptoms. She has tried acetaminophen and oral narcotics for the symptoms. The treatment provided mild relief.           ROS     Review of Systems   Constitutional: Negative for fever.   HENT: Negative for sinus pressure.    Eyes: Negative for photophobia.   Respiratory: Negative for shortness of breath.    Cardiovascular: Negative for chest pain and PND.   Neurological: Positive for headaches. Negative for dizziness, speech difficulty, weakness and numbness.       Patient Active Problem List   Diagnosis   . Senile osteoporosis   . Chronic kidney disease   . Depression   . Essential hypertension, benign   . Hypothyroidism       Past Medical History:   Diagnosis Date   . Anemia    . Chronic kidney disease, stage I  06/2013   . Depression age 25     followed by Psychiatrist.    . Disorder of thyroid     followed by Endocrinologist.    . Osteoporosis, idiopathic            The following portions of the patient's history were reviewed and updated as appropriate: Allergies, Current Medications, Past Family History, Past Medical history, Past social history, Past surgical history, and Problem List.      PHYSICAL EXAM     Vitals:    02/24/17 0811   BP: 127/81   BP Site: Left arm   Patient Position: Sitting   Cuff Size: Medium   Pulse: 76   Resp: 12   Temp: 97.1 F (36.2 C)   TempSrc: Oral   SpO2: 97%   Weight: 49.4 kg (109 lb)   Height: 1.575 m (5\' 2" )         Physical Exam   Constitutional: She is oriented to person, place, and time. She appears well-developed and well-nourished.   Eyes: Conjunctivae and EOM are normal. Pupils are equal, round, and reactive to light.   Cardiovascular: Normal rate, regular rhythm, normal heart sounds and intact distal pulses.    Pulmonary/Chest: Effort normal and breath sounds normal. No respiratory distress. She has no wheezes. She has no rales.   Musculoskeletal: Normal range of motion. She exhibits no edema or tenderness.   Neurological: She is alert and oriented to person, place, and time. She has normal reflexes. No cranial nerve deficit. Coordination normal.             Signed,  Clydell Hakim, MD  02/24/2017

## 2017-03-27 ENCOUNTER — Other Ambulatory Visit: Payer: Self-pay | Admitting: Physical Medicine & Rehabilitation

## 2017-03-27 DIAGNOSIS — M5382 Other specified dorsopathies, cervical region: Secondary | ICD-10-CM

## 2017-03-28 ENCOUNTER — Ambulatory Visit: Payer: Medicare Other | Attending: Physical Medicine & Rehabilitation

## 2017-03-28 DIAGNOSIS — M5382 Other specified dorsopathies, cervical region: Secondary | ICD-10-CM | POA: Insufficient documentation

## 2017-03-28 DIAGNOSIS — M47812 Spondylosis without myelopathy or radiculopathy, cervical region: Secondary | ICD-10-CM | POA: Insufficient documentation

## 2017-03-28 DIAGNOSIS — M791 Myalgia: Secondary | ICD-10-CM | POA: Insufficient documentation

## 2017-03-28 DIAGNOSIS — M50322 Other cervical disc degeneration at C5-C6 level: Secondary | ICD-10-CM | POA: Insufficient documentation

## 2017-03-28 DIAGNOSIS — R51 Headache: Secondary | ICD-10-CM | POA: Insufficient documentation

## 2017-03-28 DIAGNOSIS — M545 Low back pain: Secondary | ICD-10-CM | POA: Insufficient documentation

## 2017-03-28 DIAGNOSIS — M4802 Spinal stenosis, cervical region: Secondary | ICD-10-CM | POA: Insufficient documentation

## 2017-05-19 ENCOUNTER — Ambulatory Visit (INDEPENDENT_AMBULATORY_CARE_PROVIDER_SITE_OTHER): Payer: Medicare Other | Admitting: Vascular Neurology

## 2017-05-19 ENCOUNTER — Encounter (INDEPENDENT_AMBULATORY_CARE_PROVIDER_SITE_OTHER): Payer: Self-pay | Admitting: Vascular Neurology

## 2017-05-19 VITALS — BP 134/80 | HR 80 | Ht 63.0 in | Wt 113.0 lb

## 2017-05-19 DIAGNOSIS — R519 Headache, unspecified: Secondary | ICD-10-CM | POA: Insufficient documentation

## 2017-05-19 DIAGNOSIS — G43709 Chronic migraine without aura, not intractable, without status migrainosus: Secondary | ICD-10-CM

## 2017-05-19 DIAGNOSIS — G479 Sleep disorder, unspecified: Secondary | ICD-10-CM | POA: Insufficient documentation

## 2017-05-19 DIAGNOSIS — M542 Cervicalgia: Secondary | ICD-10-CM | POA: Insufficient documentation

## 2017-05-19 DIAGNOSIS — R400 Somnolence: Secondary | ICD-10-CM | POA: Insufficient documentation

## 2017-05-19 DIAGNOSIS — R51 Headache: Secondary | ICD-10-CM

## 2017-05-19 DIAGNOSIS — Z8659 Personal history of other mental and behavioral disorders: Secondary | ICD-10-CM | POA: Insufficient documentation

## 2017-05-19 DIAGNOSIS — M489 Spondylopathy, unspecified: Secondary | ICD-10-CM | POA: Insufficient documentation

## 2017-05-19 DIAGNOSIS — G478 Other sleep disorders: Secondary | ICD-10-CM | POA: Insufficient documentation

## 2017-05-19 DIAGNOSIS — IMO0002 Reserved for concepts with insufficient information to code with codable children: Secondary | ICD-10-CM | POA: Insufficient documentation

## 2017-05-19 MED ORDER — VENLAFAXINE HCL ER 75 MG PO CP24
225.0000 mg | ORAL_CAPSULE | Freq: Every day | ORAL | 2 refills | Status: DC
Start: 2017-05-19 — End: 2019-01-29

## 2017-05-19 NOTE — Progress Notes (Signed)
Subjective:      Patient ID: Katherine Mays is a 66 y.o. female.    HPI   The patient was last seen by Dr. Chales Abrahams almost 2 years ago.  Today she says she is complaining of headaches, and tells me that she saw the spine doctor recently and is scheduled for injections in her neck in the near future.  She does have neck pain, in addition to the headaches.  She has had headaches for many years, starting in 2012.  This was a time where she was under much more stress.  The headaches subsided for bit, returning in 2014, and then again in 2016 when she saw Dr. Chales Abrahams.  Now she has had headaches on a daily basis.  She occasionally will get nauseous, photophobic, phonophobia, and some smell sensitivity.  The headaches can be as short as 30 or 60 minutes, but typically last all day or 24 hours.  She has never been on any migraine specific medications.  She did try Topamax at one point.  She was prescribed Pamelor at the last visit, but did not try this.  She takes Effexor normally for depression, and has been on other medications for depression, PTSD, bipolar disorder.    She does not sleep well, using Ambien at night.  If she does not take it.  She says she is up all night.  She denies obvious snoring or kicking, but is not refreshed regardless of how much sleep she gets.  This has been going on for at least the last 2 years.  She complains of excessive daytime sleepiness.    Overall, she feels like her symptoms are getting worse.    MRI Cervical Spine 03/2017:   IMPRESSION:   1. 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 There is   mild to moderate foraminal stenosis.   2. Disc disease C6-C7 with minimal narrowing of central canal. There is   mild to moderate foraminal stenosis.   3. Milder degenerative change at other levels.      MRI brain 2013: reportedly normal, report not available today.    The following portions of the patient's history were reviewed and updated as appropriate: allergies, current  medications, past family history, past medical history, past social history, past surgical history and problem list.      Review of Systems    Constitutional: Negative for fever.   Respiratory: Negative for shortness of breath.    Cardiovascular: Negative for chest pain.   Gastrointestinal: Negative for abdominal pain.   Neurological: Negative for dizziness, weakness, numbness.   Psychiatric/Behavioral: Positive for sleep disturbance.   All other systems reviewed and are negative except pertinent positives as noted above in HPI.     Objective:   Neurologic Exam  Vitals:    05/19/17 1104   BP: 134/80   Pulse: 80        General: WDWN, NAD. Vital signs reviewed.  HEENT: No nasal discharges, no obvious oral masses  Heart: RRR  Neck: Supple, w/o bruits noted. Full ROM.  Extremities: No edema noted in the hands or feet.   Chest:  CTA    Mental Status: The patient was awake, alert, appeared oriented and was appropriate. Speech was fluent and the patient followed commands well. Attention, concentration, and memory appeared intact. Fund of knowledge appeared appropriate for education level.   Cranial Nerves: Pupils were equally round and reactive to light bilaterally. Fundoscopic exam shows sharps discs without pallor bilaterally. Extraocular movements  were intact without nystagmus. Hearing was intact to conversational speech. Face was symmetric. Tongue appeared midline.   Motor: Good/full strength throughout without clear focality or weakness. Bulk appeared normal for body habitus. No obvious tremor noted.   Sensation: light touch was grossly intact.   Coordination: Finger to nose testing was intact without dysmetria.   Gait: Normal and steady.  DTRs 2+ throughout, symmetric.    Assessment:     1. Chronic daily headache -Chronic severe condition, progressively getting worse.     2. Chronic migraine    3. Cervicalgia -Chronic severe condition    4. Cervical spine disease    5. Sleep difficulties -Chronic severe condition     6. Unrefreshed by sleep    7. Daytime sleepiness    8. History of depression      Her cervical spine MRI findings may explain her neck/shoulder pain, but likely do not explain the migrainous headaches or sleep issues.    Plan:     1.  She will get me the results of her prior MRI brain for review.  2.  Continue Effexor XR, we will increase to 225 mg daily to see if that has any additional benefit for her headaches.  3.  Ibuprofen as needed, tramadol for breakthrough pain.  4.  Check overnight polysomnogram, given complaints of unrefreshing sleep.  This could contribute to chronic daily headaches.  5.  We discussed potentially using Botox as a preventative agent, for her migraine headaches but would also likely help with her chronic neck pain.  6.  Further recommendations pending results of above.  7.  Follow-up in 6-8 weeks for reassessment.    The patient will return for follow-up as outlined above. If there are any problems with medications (if prescribed) or questions about any available test results, or if there are any new symptoms or changes in current symptoms, the patient is instructed to call the office.       Omeka Holben B. Stacy Gardner, MD  Board Certified, Neurology  Board Certified, Clinical Neurophysiology  Board Certified, Vascular Neurology  Board Certified, Sleep Medicine

## 2017-05-19 NOTE — Patient Instructions (Signed)
Can try for migraine prevention:    Magnesium oxide 250-400mg once or twice a day  Riboflavin (B2) 200-400mg once a day  Co Q 300 mg once a day      Please complete the survey that you will receive by email or regular mail. Your feedback is very important to us!

## 2017-05-22 ENCOUNTER — Encounter (INDEPENDENT_AMBULATORY_CARE_PROVIDER_SITE_OTHER): Payer: Self-pay

## 2017-05-22 NOTE — Progress Notes (Signed)
Patient called to see if someone would be able to call her pharmacy to clarify a question they had on her recent Effexor script. Apparently they had been trying to reach [place], but have been unable to thus the patient's refill has been held up. I called [facility] home delivery and confirmed with them the 150mg  cap had been discontinued and replaced with x3 75mg  cap (225 total). The pharmacist said he would update her record and get the new Rx out to her shortly.

## 2017-06-04 ENCOUNTER — Encounter (INDEPENDENT_AMBULATORY_CARE_PROVIDER_SITE_OTHER): Payer: Self-pay

## 2017-09-08 ENCOUNTER — Ambulatory Visit (INDEPENDENT_AMBULATORY_CARE_PROVIDER_SITE_OTHER): Payer: Medicare Other | Admitting: Internal Medicine

## 2017-09-29 ENCOUNTER — Ambulatory Visit (INDEPENDENT_AMBULATORY_CARE_PROVIDER_SITE_OTHER): Payer: Medicare Other | Admitting: Internal Medicine

## 2017-10-31 ENCOUNTER — Ambulatory Visit (INDEPENDENT_AMBULATORY_CARE_PROVIDER_SITE_OTHER): Payer: Medicare Other | Admitting: Internal Medicine

## 2017-12-18 ENCOUNTER — Ambulatory Visit (INDEPENDENT_AMBULATORY_CARE_PROVIDER_SITE_OTHER): Payer: Medicare Other | Admitting: Internal Medicine

## 2018-01-05 ENCOUNTER — Ambulatory Visit (INDEPENDENT_AMBULATORY_CARE_PROVIDER_SITE_OTHER): Payer: Medicare Other | Admitting: Internal Medicine

## 2018-01-29 ENCOUNTER — Ambulatory Visit (FREE_STANDING_LABORATORY_FACILITY): Payer: Medicare Other | Admitting: Internal Medicine

## 2018-01-29 ENCOUNTER — Other Ambulatory Visit (INDEPENDENT_AMBULATORY_CARE_PROVIDER_SITE_OTHER): Payer: Self-pay

## 2018-01-29 ENCOUNTER — Telehealth (INDEPENDENT_AMBULATORY_CARE_PROVIDER_SITE_OTHER): Payer: Self-pay | Admitting: Internal Medicine

## 2018-01-29 ENCOUNTER — Encounter (INDEPENDENT_AMBULATORY_CARE_PROVIDER_SITE_OTHER): Payer: Self-pay | Admitting: Internal Medicine

## 2018-01-29 VITALS — BP 118/80 | HR 101 | Temp 97.7°F | Resp 12 | Ht 62.5 in | Wt 113.0 lb

## 2018-01-29 DIAGNOSIS — Z1239 Encounter for other screening for malignant neoplasm of breast: Secondary | ICD-10-CM

## 2018-01-29 DIAGNOSIS — I10 Essential (primary) hypertension: Secondary | ICD-10-CM

## 2018-01-29 DIAGNOSIS — Z1231 Encounter for screening mammogram for malignant neoplasm of breast: Secondary | ICD-10-CM

## 2018-01-29 DIAGNOSIS — Z1211 Encounter for screening for malignant neoplasm of colon: Secondary | ICD-10-CM

## 2018-01-29 DIAGNOSIS — N181 Chronic kidney disease, stage 1: Secondary | ICD-10-CM

## 2018-01-29 DIAGNOSIS — R51 Headache: Secondary | ICD-10-CM

## 2018-01-29 DIAGNOSIS — E7800 Pure hypercholesterolemia, unspecified: Secondary | ICD-10-CM

## 2018-01-29 DIAGNOSIS — E039 Hypothyroidism, unspecified: Secondary | ICD-10-CM

## 2018-01-29 DIAGNOSIS — Z1212 Encounter for screening for malignant neoplasm of rectum: Secondary | ICD-10-CM

## 2018-01-29 DIAGNOSIS — G8929 Other chronic pain: Secondary | ICD-10-CM

## 2018-01-29 DIAGNOSIS — Z0000 Encounter for general adult medical examination without abnormal findings: Secondary | ICD-10-CM

## 2018-01-29 DIAGNOSIS — F418 Other specified anxiety disorders: Secondary | ICD-10-CM

## 2018-01-29 LAB — LIPID PANEL
Cholesterol / HDL Ratio: 3.4
Cholesterol: 272 mg/dL — ABNORMAL HIGH (ref 0–199)
HDL: 81 mg/dL (ref 40–9999)
LDL Calculated: 151 mg/dL — ABNORMAL HIGH (ref 0–99)
Triglycerides: 201 mg/dL — ABNORMAL HIGH (ref 34–149)
VLDL Calculated: 40 mg/dL (ref 10–40)

## 2018-01-29 LAB — CBC AND DIFFERENTIAL
Absolute NRBC: 0 10*3/uL
Basophils Absolute Automated: 0.08 10*3/uL (ref 0.00–0.20)
Basophils Automated: 1.2 %
Eosinophils Absolute Automated: 0.24 10*3/uL (ref 0.00–0.70)
Eosinophils Automated: 3.6 %
Hematocrit: 45.3 % (ref 37.0–47.0)
Hgb: 14.3 g/dL (ref 12.0–16.0)
Immature Granulocytes Absolute: 0.08 10*3/uL — ABNORMAL HIGH
Immature Granulocytes: 1.2 %
Lymphocytes Absolute Automated: 2.34 10*3/uL (ref 0.50–4.40)
Lymphocytes Automated: 35.2 %
MCH: 30.6 pg (ref 28.0–32.0)
MCHC: 31.6 g/dL — ABNORMAL LOW (ref 32.0–36.0)
MCV: 97 fL (ref 80.0–100.0)
MPV: 9.4 fL (ref 9.4–12.3)
Monocytes Absolute Automated: 0.63 10*3/uL (ref 0.00–1.20)
Monocytes: 9.5 %
Neutrophils Absolute: 3.27 10*3/uL (ref 1.80–8.10)
Neutrophils: 49.3 %
Nucleated RBC: 0 /100 WBC (ref 0.0–1.0)
Platelets: 336 10*3/uL (ref 140–400)
RBC: 4.67 10*6/uL (ref 4.20–5.40)
RDW: 12 % (ref 12–15)
WBC: 6.64 10*3/uL (ref 3.50–10.80)

## 2018-01-29 LAB — COMPREHENSIVE METABOLIC PANEL
ALT: 15 U/L (ref 0–55)
AST (SGOT): 15 U/L (ref 5–34)
Albumin/Globulin Ratio: 2.2 (ref 0.9–2.2)
Albumin: 4.3 g/dL (ref 3.5–5.0)
Alkaline Phosphatase: 41 U/L (ref 37–106)
BUN: 13 mg/dL (ref 7.0–19.0)
Bilirubin, Total: 0.6 mg/dL (ref 0.2–1.2)
CO2: 31 mEq/L — ABNORMAL HIGH (ref 21–29)
Calcium: 10.1 mg/dL (ref 8.5–10.5)
Chloride: 98 mEq/L — ABNORMAL LOW (ref 100–111)
Creatinine: 0.9 mg/dL (ref 0.4–1.5)
Globulin: 2 g/dL (ref 2.0–3.7)
Glucose: 92 mg/dL (ref 70–100)
Potassium: 4.3 mEq/L (ref 3.5–5.1)
Protein, Total: 6.3 g/dL (ref 6.0–8.3)
Sodium: 140 mEq/L (ref 136–145)

## 2018-01-29 LAB — URINALYSIS
Bilirubin, UA: NEGATIVE
Blood, UA: NEGATIVE
Glucose, UA: NEGATIVE
Ketones UA: NEGATIVE
Leukocyte Esterase, UA: NEGATIVE
Nitrite, UA: NEGATIVE
Protein, UR: NEGATIVE
Specific Gravity UA: 1.013 (ref 1.001–1.035)
Urine pH: 6.5 (ref 5.0–8.0)
Urobilinogen, UA: 0.2

## 2018-01-29 LAB — HEMOLYSIS INDEX: Hemolysis Index: 5 (ref 0–18)

## 2018-01-29 LAB — GFR: EGFR: >60

## 2018-01-29 MED ORDER — NORTRIPTYLINE HCL 10 MG PO CAPS
10.00 mg | ORAL_CAPSULE | Freq: Every evening | ORAL | 5 refills | Status: DC
Start: 2018-01-29 — End: 2018-07-29

## 2018-01-29 NOTE — Progress Notes (Signed)
Katherine Mays is a 67 y.o. female who presents today for a Medicare Annual Wellness Visit.     Health Risk Assessment     During the past month, how would you rate your general health?:  Fair  Which of the following tasks can you do without assistance - drive or take the bus alone; shop for groceries or clothes; prepare your own meals; do your own housework/laundry; handle your own finances/pay bills; eat, bathe or get around your home?:  Drive or take the bus alone, Shop for groceries or clothes, Prepare your own meals, Do your own housework/laundry, Handle your own finances/pay bills, Eat, bathe, dress or get around your home  Which of the following problems have you been bothered by in the past month - dizzy when standing up; problems using the phone; feeling tired or fatigued; moderate or severe body pain?: None of these  Do you exercise for about 20 minutes 3 or more days per week?:  No  During the past month was someone available to help if you needed and wanted help?  For example, if you felt nervous, lonely, got sick and had to stay in bed, needed someone to talk to, needed help with daily chores or needed help just taking care of yourself.: Yes  Do you always wear a seat belt?: Yes  Do you have any trouble taking medications the way you have been told to take them?: No  Have you been given any information that can help you with keeping track of your medications?: No  Do you have trouble paying for your medications?: Yes  Have you been given any information that can help you with hazards in your house, such as scatter rugs, furniture, etc?: No  Do you feel unsteady when standing or walking?: No  Do you worry about falling?: No  Have you fallen two or more times in the past year?: No  Did you suffer any injuries from your falls in the past year?: No    Additional Concerns    Patient Care Team:  Clydell Hakim, MD as PCP - General (Internal Medicine)  Eddie North, MD as Consulting Physician  (Nephrology)    Past Medical History:   Diagnosis Date   . Anemia    . Chronic kidney disease, stage I 06/2013   . Depression age 54     followed by Psychiatrist.    . Disorder of thyroid     followed by Endocrinologist.    . Osteoporosis, idiopathic      Past Surgical History:   Procedure Laterality Date   . CESAREAN SECTION      four   . HYSTERECTOMY  1995   . NECK SURGERY     . TONSILLECTOMY     . WISDOM TOOTH EXTRACTION       Allergies   Allergen Reactions   . Keflex [Cephalexin]       Current Outpatient Prescriptions   Medication Sig Dispense Refill   . dilTIAZem (CARDIZEM CD) 120 MG 24 hr capsule Take 1 capsule (120 mg total) by mouth daily. 30 capsule 0   . levothyroxine (SYNTHROID) 75 MCG tablet Take 75 mcg by mouth.     . PROLIA 60 MG/ML Solution subcutaneous injection      . venlafaxine (EFFEXOR-XR) 75 MG 24 hr capsule Take 3 capsules (225 mg total) by mouth daily. 270 capsule 2   . nortriptyline (PAMELOR) 10 MG capsule Take 1 capsule (10 mg total) by mouth nightly. 30 capsule  5   . traMADol (ULTRAM) 50 MG tablet Take 50 mg by mouth every 6 (six) hours as needed for Pain.       No current facility-administered medications for this visit.       Social History   Substance Use Topics   . Smoking status: Never Smoker   . Smokeless tobacco: Never Used   . Alcohol use Yes      Comment: 5-6 drinks per week      Family History   Problem Relation Age of Onset   . Thyroid disease Mother    . Diabetes Father    . Heart disease Father 55        M I    . Osteoporosis Father    . Stroke Father         The following sections were reviewed this encounter by the provider:   Tobacco  Allergies  Meds  Med Hx  Surg Hx  Fam Hx  Soc Hx        Hospitalizations  no hospitalizations within past 6 months    Depression Screening Negative    See related Activity or Flowsheet    Functional Ability    Falls Risk:  home does not have throw rugs, poor lighting or a slippery bath tub or shower, < or = 1 fall within past year and  timed "Get Up and Go Evaluation" < 20 seconds  Hearing:  hearing within normal limits  Exercise:  Light ( i.e. stretching or slow walking ) and Moderate ( i.e. brisk walking )  ADL's:   Bathing - independent   Dressing - independent   Mobility - independent   Transfer - independent   Eating - independent}   Toileting - independent   ADL assistance not needed    Discussion of Advance Directives: Has no Advanced Directive. Form provided.     Assessment    BP 118/80 (BP Site: Left arm, Patient Position: Sitting, Cuff Size: Medium)   Pulse (!) 101   Temp 97.7 F (36.5 C) (Oral)   Resp 12   Ht 1.588 m (5' 2.5")   Wt 51.3 kg (113 lb)   SpO2 98%   BMI 20.34 kg/m      Vision Screening (required for IPPE only): Patient states eye exam performed elsewhere within past 12 months  Screening EKG (IPPE only): not indicated    Physical Examination: General appearance - alert, well appearing, and in no distress  Mental status - alert, oriented to person, place, and time  Chest - clear to auscultation, no wheezes, rales or rhonchi, symmetric air entry  Heart - normal rate, regular rhythm, normal S1, S2, no murmurs, rubs, clicks or gallops  Abdomen - soft, nontender, nondistended, no masses or organomegaly  Neurological - alert, oriented, normal speech, no focal findings or movement disorder noted      Evaluation of Cognitive Function    Mood/affect: Appropriate  Appearance: neatly groomed, appropriately and adequately nourished  Family member/caregiver input: Not present        AWV Mini-Cog Result:  > 3 points - negative screen for dementia    Assessment/Plan    1. Medicare annual wellness visit, subsequent    2. Screening for breast cancer    3. Screening for colorectal cancer  - Ambulatory referral to Gastroenterology    4. Essential hypertension, benign  - Comprehensive metabolic panel  - CBC and differential  - Urinalysis    5. Hypothyroidism, unspecified type  6. Pure hypercholesterolemia  - Lipid panel    7. Stage 1  chronic kidney disease  - Comprehensive metabolic panel  - CBC and differential  - Urinalysis    8. Chronic nonintractable headache, unspecified headache type  - nortriptyline (PAMELOR) 10 MG capsule; Take 1 capsule (10 mg total) by mouth nightly.  Dispense: 30 capsule; Refill: 5    9. Depression with anxiety  - nortriptyline (PAMELOR) 10 MG capsule; Take 1 capsule (10 mg total) by mouth nightly.  Dispense: 30 capsule; Refill: 5     Patient has chronic insomnia.  Daily headaches and muscle pain during daytime.  This is most likely a fibromyalgia syndrome.  I advised her to try Pamelor 10 mg at bedtime.  Side effects explained.      Clydell Hakim, MD   01/29/2018

## 2018-01-29 NOTE — Progress Notes (Signed)
Have you seen any specialists/other providers since your last visit with [place]?    No    Arm preference verified?   Yes    The patient is due for colonoscopy and shingles vaccine,care plan letter

## 2018-01-29 NOTE — Patient Instructions (Addendum)
Women's Preventive Wellness Plan  Today's Date: January 29, 2018    Patient Name:Katherine Mays    Date of Birth: Jan 05, 1951     As part of your wellness benefit, Medicare makes many screening tests available to you at no charge.  A complete list of these tests can be found at their website, [email or web address]. However, many of these tests or recommendations are out of date, or may not apply to you. After careful consideration of your own personal health needs, the following testing is recommended for you:      Preventive Service    Up-to-date (UTD)/Due/Not Applicable (N/A)   Last Done   Medicare Frequency   Body Mass Index   Up-to-date January 29, 2018  (BMI):Body mass index is 20.34 kg/m.   Height:Height: 158.8 cm (5' 2.5")  Weight:Weight: 51.3 kg (113 lb)  Annually   Blood Pressure: Up-to-date January 29, 2018    BP: 118/80    Every 2 yrs, if BP </= 120/80 mm hg   Annually, if BP >120-139/80-89 mm hg   Cholesterol Testing Due Lab Results   Component Value Date    LDL 130 (H) 09/26/2016      Regularly beginning at age 82 with risk factors   Diabetes Screening Due Lab Results   Component Value Date    GLU 88 02/24/2017       If prediabetes, one screening every 6 months   Otherwise, one screening every 12 months with certain risk factors for diabetes   Osteoporosis Screening   (Bone Density Measurement)  Up-to-date   Routinely, for women aged 65+   Routinely, for women aged 60-64 with risk factors   Breast Cancer Screening   (Mammogram) Up-to-date   Every 2 yrs, aged 67-74 yrs   Cervical Cancer Screening   (Pap Smear)  Not applicable   Annually if at high risk for developing cervical or vaginal cancer or childbearing age with abnormal Pap test within past 3 years; Every 2 years for women at normal risk   HPV: Once every 5 years; All asymptomatic female Medicare beneficiaries aged 2 to 55 years   Colorectal Cancer Screening Due   Annually,  Fecal Occult Blood Stool (FOBS)   Every 5 yrs, Sigmoidoscopy with FOBS   Every 10 yrs, Colonoscopy   Every 3 yrs, Cologuard   Depression Screening Up-to-date January 29, 2018   As necessary for those with risk factors   Sexually Transmitted Diseases (STDs) & HIV Screening Not applicable   As necessary for those with risk factors   Alcohol Misuse Screening Not applicable   As necessary for those with risk factors   Immunizations:   Up-to-date Immunization History   Administered Date(s) Administered   . Influenza (Im) Preserved TRIVALENT VACCINE 10/01/2013   . Influenza quadrivalent (IM) 3 Yrs & greater 10/12/2014   . Pneumococcal 23 valent 10/21/2016   . Pneumococcal Conjugate 13-Valent 10/10/2015   . Tdap 10/01/2013   . Zoster 10/01/2013     Prevnar 13: 1 dose after age 47   Pneumovax 25: 1 dose 1 year after Prevnar   Influenza: Annually   Advance Directive Due   Once; update as needed   Medical Nutrition Therapy Up-to-date   As necessary for diabetes or renal disease   Smoking Cessation Counseling Not applicable Counseling given: Not Answered    Frequency: two cessation attempts per year.   Glaucoma Screening Up-to-date  . Annually for covered high risk Medicare beneficiaries (one of the following: DM, FHx Glaucoma, African-Americans  aged 10+, Hispanic-Americans aged 65+)   Hepatitis C Virus (HCV) Screening Not applicable   Annually only for high risk behavior   Once if born between [birth year] and 1966 and are not considered high risk   Lung Cancer Screening Not applicable   Annually if asymptomatic, tobacco smoking history of at least 30 pack-years (one pack-year = smoking one pack per day for one year; 1 pack = 20 cigarettes), and current smoker or one who has quit smoking within the last 15 years     Your major risk factors:       Hypertension     Recommendations for improvement:    Low cholesterol diet, Exercise and Weight bearing exercises     Referrals:    See After Visit Summary orders        Nortriptyline capsules  Brand Names: Aventyl, Pamelor  What is this medicine?  NORTRIPTYLINE (nor TRIP ti leen) is used to treat depression.  How should I use this medicine?  Take this medicine by mouth with a glass of water. Follow the directions on the prescription label. Take your doses at regular intervals. Do not take it more often than directed. Do not stop taking this medicine suddenly except upon the advice of your doctor. Stopping this medicine too quickly may cause serious side effects or your condition may worsen.  A special MedGuide will be given to you by the pharmacist with each prescription and refill. Be sure to read this information carefully each time.  Talk to your pediatrician regarding the use of this medicine in children. Special care may be needed.  What side effects may I notice from receiving this medicine?  Side effects that you should report to your doctor or health care professional as soon as possible:   allergic reactions like skin rash, itching or hives, swelling of the face, lips, or tongue   anxious   breathing problems   changes in vision   confusion   elevated mood, decreased need for sleep, racing thoughts, impulsive behavior   eye pain   fast, irregular heartbeat   feeling faint or lightheaded, falls   feeling agitated, angry, or irritable   fever with increased sweating   hallucination, loss of contact with reality   seizures   stiff muscles   suicidal thoughts or other mood changes   tingling, pain, or numbness in the feet or hands   trouble passing urine or change in the amount of urine   trouble sleeping   unusually weak or tired   vomiting   yellowing of the eyes or skin  Side effects that usually do not require medical attention (report to your doctor or health care professional if they continue or are bothersome):   change in sex drive or performance   change in appetite or weight   constipation   dizziness   dry  mouth   nausea   tired   tremors   upset stomach  What may interact with this medicine?  Do not take this medicine with any of the following medications:   arsenic trioxide   certain medicines medicines for irregular heart beat   cisapride   halofantrine   linezolid   MAOIs like Carbex, Eldepryl, Marplan, Nardil, and Parnate   methylene blue (injected into a vein)   other medicines for mental depression   phenothiazines like perphenazine, thioridazine and chlorpromazine   pimozide   probucol   procarbazine   sparfloxacin   St. John's Wort  ziprasidone  This medicine may also interact with any of the following medications:   atropine and related drugs like hyoscyamine, scopolamine, tolterodine and others   barbiturate medicines for inducing sleep or treating seizures, such as phenobarbital   cimetidine   medicines for diabetes   medicines for seizures like carbamazepine or phenytoin   reserpine   thyroid medicine  What if I miss a dose?  If you miss a dose, take it as soon as you can. If it is almost time for your next dose, take only that dose. Do not take double or extra doses.  Where should I keep my medicine?  Keep out of the reach of children.  Store at room temperature between 15 and 30 degrees C (59 and 86 degrees F). Keep container tightly closed. Throw away any unused medicine after the expiration date.  What should I tell my health care provider before I take this medicine?  They need to know if you have any of these conditions:   an alcohol problem   bipolar disorder or schizophrenia   difficulty passing urine, prostate trouble   glaucoma   heart disease or recent heart attack   liver disease   over active thyroid   seizures   thoughts or plans of suicide or a previous suicide attempt or family history of suicide attempt   an unusual or allergic reaction to nortriptyline, other medicines, foods, dyes, or preservatives   pregnant or trying to get  pregnant   breast-feeding  What should I watch for while using this medicine?  Tell your doctor if your symptoms do not get better or if they get worse. Visit your doctor or health care professional for regular checks on your progress. Because it may take several weeks to see the full effects of this medicine, it is important to continue your treatment as prescribed by your doctor.  Patients and their families should watch out for new or worsening thoughts of suicide or depression. Also watch out for sudden changes in feelings such as feeling anxious, agitated, panicky, irritable, hostile, aggressive, impulsive, severely restless, overly excited and hyperactive, or not being able to sleep. If this happens, especially at the beginning of treatment or after a change in dose, call your health care professional.  Bonita Quin may get drowsy or dizzy. Do not drive, use machinery, or do anything that needs mental alertness until you know how this medicine affects you. Do not stand or sit up quickly, especially if you are an older patient. This reduces the risk of dizzy or fainting spells. Alcohol may interfere with the effect of this medicine. Avoid alcoholic drinks.  Do not treat yourself for coughs, colds, or allergies without asking your doctor or health care professional for advice. Some ingredients can increase possible side effects.  Your mouth may get dry. Chewing sugarless gum or sucking hard candy, and drinking plenty of water may help. Contact your doctor if the problem does not go away or is severe.  This medicine may cause dry eyes and blurred vision. If you wear contact lenses you may feel some discomfort. Lubricating drops may help. See your eye doctor if the problem does not go away or is severe.  This medicine can cause constipation. Try to have a bowel movement at least every 2 to 3 days. If you do not have a bowel movement for 3 days, call your doctor or health care professional.  This medicine can make you more  sensitive to the sun. Keep  out of the sun. If you cannot avoid being in the sun, wear protective clothing and use sunscreen. Do not use sun lamps or tanning beds/booths.  NOTE:This sheet is a summary. It may not cover all possible information. If you have questions about this medicine, talk to your doctor, pharmacist, or health care provider. Copyright 2018 [employer]

## 2018-01-29 NOTE — Telephone Encounter (Signed)
Patient requested when  blood work results are ready  to be mailed to her house--her copy machine does not works and she want a hard copy..:)

## 2018-02-24 ENCOUNTER — Encounter (INDEPENDENT_AMBULATORY_CARE_PROVIDER_SITE_OTHER): Payer: Self-pay | Admitting: Internal Medicine

## 2018-02-27 ENCOUNTER — Encounter (INDEPENDENT_AMBULATORY_CARE_PROVIDER_SITE_OTHER): Payer: Self-pay

## 2018-03-13 ENCOUNTER — Other Ambulatory Visit (INDEPENDENT_AMBULATORY_CARE_PROVIDER_SITE_OTHER): Payer: Self-pay | Admitting: Internal Medicine

## 2018-03-13 DIAGNOSIS — I10 Essential (primary) hypertension: Secondary | ICD-10-CM

## 2018-03-13 MED ORDER — DILTIAZEM HCL ER COATED BEADS 120 MG PO CP24
120.00 mg | ORAL_CAPSULE | Freq: Every day | ORAL | 5 refills | Status: DC
Start: 2018-03-13 — End: 2018-07-29

## 2018-05-26 ENCOUNTER — Other Ambulatory Visit: Payer: Self-pay | Admitting: Family Medicine

## 2018-06-19 ENCOUNTER — Other Ambulatory Visit: Payer: Self-pay | Admitting: Rheumatology

## 2018-06-19 ENCOUNTER — Other Ambulatory Visit: Payer: Self-pay | Admitting: Family Medicine

## 2018-06-22 ENCOUNTER — Encounter (INDEPENDENT_AMBULATORY_CARE_PROVIDER_SITE_OTHER): Payer: Self-pay | Admitting: Internal Medicine

## 2018-06-22 ENCOUNTER — Encounter (INDEPENDENT_AMBULATORY_CARE_PROVIDER_SITE_OTHER): Payer: Self-pay

## 2018-07-29 ENCOUNTER — Encounter (INDEPENDENT_AMBULATORY_CARE_PROVIDER_SITE_OTHER): Payer: Self-pay | Admitting: Internal Medicine

## 2018-07-29 ENCOUNTER — Ambulatory Visit (FREE_STANDING_LABORATORY_FACILITY): Payer: Medicare Other | Admitting: Internal Medicine

## 2018-07-29 ENCOUNTER — Other Ambulatory Visit (INDEPENDENT_AMBULATORY_CARE_PROVIDER_SITE_OTHER): Payer: Self-pay

## 2018-07-29 ENCOUNTER — Telehealth (INDEPENDENT_AMBULATORY_CARE_PROVIDER_SITE_OTHER): Payer: Self-pay | Admitting: Internal Medicine

## 2018-07-29 VITALS — BP 126/77 | HR 83 | Temp 97.9°F | Resp 12 | Ht 62.5 in | Wt 113.4 lb

## 2018-07-29 DIAGNOSIS — Y92009 Unspecified place in unspecified non-institutional (private) residence as the place of occurrence of the external cause: Secondary | ICD-10-CM

## 2018-07-29 DIAGNOSIS — S92351A Displaced fracture of fifth metatarsal bone, right foot, initial encounter for closed fracture: Secondary | ICD-10-CM

## 2018-07-29 DIAGNOSIS — W19XXXA Unspecified fall, initial encounter: Secondary | ICD-10-CM

## 2018-07-29 DIAGNOSIS — I10 Essential (primary) hypertension: Secondary | ICD-10-CM

## 2018-07-29 DIAGNOSIS — E7800 Pure hypercholesterolemia, unspecified: Secondary | ICD-10-CM

## 2018-07-29 DIAGNOSIS — Z5181 Encounter for therapeutic drug level monitoring: Secondary | ICD-10-CM

## 2018-07-29 DIAGNOSIS — E039 Hypothyroidism, unspecified: Secondary | ICD-10-CM

## 2018-07-29 LAB — CBC AND DIFFERENTIAL
Absolute NRBC: 0 10*3/uL (ref 0.00–0.00)
Basophils Absolute Automated: 0.06 10*3/uL (ref 0.00–0.08)
Basophils Automated: 0.9 %
Eosinophils Absolute Automated: 0.24 10*3/uL (ref 0.00–0.44)
Eosinophils Automated: 3.7 %
Hematocrit: 41 % (ref 34.7–43.7)
Hgb: 12.9 g/dL (ref 11.4–14.8)
Immature Granulocytes Absolute: 0.04 10*3/uL (ref 0.00–0.07)
Immature Granulocytes: 0.6 %
Lymphocytes Absolute Automated: 1.7 10*3/uL (ref 0.42–3.22)
Lymphocytes Automated: 26.5 %
MCH: 30.9 pg (ref 25.1–33.5)
MCHC: 31.5 g/dL (ref 31.5–35.8)
MCV: 98.3 fL — ABNORMAL HIGH (ref 78.0–96.0)
MPV: 9 fL (ref 8.9–12.5)
Monocytes Absolute Automated: 0.62 10*3/uL (ref 0.21–0.85)
Monocytes: 9.7 %
Neutrophils Absolute: 3.76 10*3/uL (ref 1.10–6.33)
Neutrophils: 58.6 %
Nucleated RBC: 0 /100 WBC (ref 0.0–0.0)
Platelets: 281 10*3/uL (ref 142–346)
RBC: 4.17 10*6/uL (ref 3.90–5.10)
RDW: 13 % (ref 11–15)
WBC: 6.42 10*3/uL (ref 3.10–9.50)

## 2018-07-29 LAB — COMPREHENSIVE METABOLIC PANEL
ALT: 15 U/L (ref 0–55)
AST (SGOT): 15 U/L (ref 5–34)
Albumin/Globulin Ratio: 2.1 (ref 0.9–2.2)
Albumin: 4.1 g/dL (ref 3.5–5.0)
Alkaline Phosphatase: 39 U/L (ref 37–106)
BUN: 13 mg/dL (ref 7.0–19.0)
Bilirubin, Total: 0.5 mg/dL (ref 0.2–1.2)
CO2: 32 mEq/L — ABNORMAL HIGH (ref 21–29)
Calcium: 9.4 mg/dL (ref 8.5–10.5)
Chloride: 101 mEq/L (ref 100–111)
Creatinine: 0.9 mg/dL (ref 0.4–1.5)
Globulin: 2 g/dL (ref 2.0–3.7)
Glucose: 82 mg/dL (ref 70–100)
Potassium: 4.1 mEq/L (ref 3.5–5.1)
Protein, Total: 6.1 g/dL (ref 6.0–8.3)
Sodium: 141 mEq/L (ref 136–145)

## 2018-07-29 LAB — LIPID PANEL
Cholesterol / HDL Ratio: 2.7
Cholesterol: 239 mg/dL — ABNORMAL HIGH (ref 0–199)
HDL: 87 mg/dL (ref 40–9999)
LDL Calculated: 129 mg/dL — ABNORMAL HIGH (ref 0–99)
Triglycerides: 113 mg/dL (ref 34–149)
VLDL Calculated: 23 mg/dL (ref 10–40)

## 2018-07-29 LAB — HEMOLYSIS INDEX: Hemolysis Index: 4 (ref 0–18)

## 2018-07-29 LAB — GFR: EGFR: >60

## 2018-07-29 MED ORDER — DILTIAZEM HCL ER COATED BEADS 120 MG PO CP24
120.00 mg | ORAL_CAPSULE | Freq: Every day | ORAL | 3 refills | Status: DC
Start: 2018-07-29 — End: 2019-01-29

## 2018-07-29 NOTE — Progress Notes (Signed)
Have you seen any specialists/other providers since your last visit with us?    Yes     [place] preference verified?   Yes    The patient is due for colonoscopy, shingles vaccine and HepC.

## 2018-07-29 NOTE — Progress Notes (Signed)
[facility] PRIMARY CARE  [place]  PROGRESS NOTE      Patient: Katherine Mays   Date: 07/29/2018   MRN: [id number]     ASSESSMENT/PLAN     Katherine Mays is a 67 y.o. female    Chief Complaint   Patient presents with   . Hypertension   . Hyperlipidemia   . Hypothyroidism   . Foot Pain     right         1. Essential hypertension, benign  Controlled.  The risks and complications of high blood pressure have been explained. Importance of low salt diet, and weight control have been explained.    Labs and refill as follow  - CBC and differential  - Comprehensive metabolic panel  - dilTIAZem (CARDIZEM CD) 120 MG 24 hr capsule; Take 1 capsule (120 mg total) by mouth daily  Dispense: 90 capsule; Refill: 3    2. Hypothyroidism, unspecified type  Patient is followed by endocrinologist.  She recently had a thyroid function test few days ago which was normal.  Continue current dose of thyroid medication    3. Pure hypercholesterolemia  The risks and complications of dyslipidemia have been explained. Importance of low fat diet, and aerobic exercises have been explained.    Patient has elevated LDL but also has high HDL.    - Comprehensive metabolic panel  - Lipid panel    4. Medication monitoring encounter  The following tests are ordered to monitor effects and side effects of the current medications.    - CBC and differential  - Comprehensive metabolic panel  - Lipid panel    5. Closed displaced fracture of fifth metatarsal bone of right foot, initial encounter  Patient has seen specialist.  Incidence occurred on 05/26/2018.  Surgery has not been performed.  Currently treated conservatively.    6. Fall in home, initial encounter  Larey Seat down the steps at home on 05/26/2018.           MEDICATIONS     Current Outpatient Prescriptions   Medication Sig Dispense Refill   . dilTIAZem (CARDIZEM CD) 120 MG 24 hr capsule Take 1 capsule (120 mg total) by mouth daily 90 capsule 3   . levothyroxine (SYNTHROID) 75 MCG tablet Take 75 mcg by mouth.     .  PROLIA 60 MG/ML Solution subcutaneous injection      . traMADol (ULTRAM) 50 MG tablet Take 50 mg by mouth 2 (two) times daily     . venlafaxine (EFFEXOR-XR) 75 MG 24 hr capsule Take 3 capsules (225 mg total) by mouth daily. (Patient taking differently: Take 225 mg by mouth daily    ) 270 capsule 2     No current facility-administered medications for this visit.        Allergies   Allergen Reactions   . Keflex [Cephalexin]        SUBJECTIVE     Chief Complaint   Patient presents with   . Hypertension   . Hyperlipidemia   . Hypothyroidism   . Foot Pain     right         Hypertension   The patient is being seen for a routine follow-up of hypertension. Hypertension is classified as chronic. There is no history of CHF, proteinuria and ESRD. Comorbid illnesses include hyperlipidemia. There are no recent interval events.  There is no interval history of chest pain, dyspnea, lower extremity edema and palpitations.  Current therapy includes calcium channel blockers. Patient is compliant with the  current regimen.   The patient's blood pressure response to medications is at goal. Average ambulatory systolic blood pressure is generally in the 120's. Average ambulatory diastolic blood pressure is generally in the 70's.    Hyperlipidemia   The patient is being seen for a routine follow-up of hyperlipidemia. Hyperlipidemia is classified as pure hypercholesterolemia. Comorbid illnesses include hypertension.  There are no recent interval events. There is no interval history of myalgias and dyspnea. Current therapy includes lifestyle changes.  Patient is compliant with the current regimen. The LDL is above goal.  The HDL is at goal.     Hypothyroidism   The patient is being seen for a routine follow-up of hypothyroidism. Hypothyroidism is classified as acquired. There are no known risk factors.  Comorbid illnesses include hyperlipidemia.  Interval symptoms include anxiety. Current therapy includes levothyroxine.  Patient is compliant  with the current regimen. The patient's response to medication is TSH at goal.     Foot Pain   This is a new problem. Episode onset: 05/26/18. The problem occurs constantly. The problem has been gradually improving. Associated symptoms include arthralgias and fatigue. Pertinent negatives include no chest pain, coughing, headaches, joint swelling, myalgias, numbness or weakness. Nothing aggravates the symptoms. She has tried ice and rest for the symptoms. The treatment provided moderate relief.           ROS     Review of Systems   Constitutional: Positive for fatigue. Negative for unexpected weight change.   Eyes: Negative for visual disturbance.   Respiratory: Negative for cough, shortness of breath and stridor.    Cardiovascular: Negative for chest pain, palpitations and leg swelling.   Musculoskeletal: Positive for arthralgias and gait problem. Negative for back pain, joint swelling, myalgias and neck stiffness.   Neurological: Negative for weakness, light-headedness, numbness and headaches.   All other systems reviewed and are negative.      Patient Active Problem List   Diagnosis   . Senile osteoporosis   . Chronic kidney disease   . Depression   . Essential hypertension, benign   . Hypothyroidism   . Chronic daily headache   . Chronic migraine   . Cervicalgia   . Cervical spine disease   . Sleep difficulties   . Unrefreshed by sleep   . Daytime sleepiness   . History of depression       Past Medical History:   Diagnosis Date   . Anemia    . Broken foot     right foot   . Chronic kidney disease, stage I 06/2013   . Depression age 24     followed by Psychiatrist.    . Disorder of thyroid     followed by Endocrinologist.    . Osteoporosis, idiopathic            The following portions of the patient's history were reviewed and updated as appropriate: Allergies, Current Medications, Past Family History, Past Medical history, Past social history, Past surgical history, and Problem List.      PHYSICAL EXAM     Vitals:     07/29/18 0724   BP: 126/77   BP Site: Left arm   Patient Position: Sitting   Cuff Size: Medium   Pulse: 83   Resp: 12   Temp: 97.9 F (36.6 C)   TempSrc: Oral   SpO2: 98%   Weight: 51.4 kg (113 lb 6.4 oz)   Height: 1.588 m (5' 2.5")  Physical Exam   Constitutional: She is oriented to person, place, and time. She appears well-developed and well-nourished.   Eyes: Pupils are equal, round, and reactive to light. Conjunctivae and EOM are normal.   Neck: Normal range of motion. Neck supple. No JVD present. No thyromegaly present.   Cardiovascular: Normal rate, regular rhythm and normal heart sounds.    Pulmonary/Chest: Effort normal and breath sounds normal. No respiratory distress. She has no wheezes. She has no rales.   Abdominal: Soft. Bowel sounds are normal. She exhibits no distension. There is no tenderness. There is no rebound.   Musculoskeletal: Normal range of motion.        Right ankle: Normal.        Left ankle: Normal.        Right lower leg: Normal.        Left lower leg: Normal.        Right foot: There is tenderness.        Left foot: Normal.        Feet:    Neurological: She is alert and oriented to person, place, and time.   Skin: Skin is warm and dry.   Psychiatric: She has a normal mood and affect. Her behavior is normal.   Vitals reviewed.            Signed,  Clydell Hakim, MD  07/29/2018

## 2018-07-29 NOTE — Telephone Encounter (Signed)
Noted  

## 2018-07-29 NOTE — Telephone Encounter (Signed)
At check out, pt review her medication list, and correct some information.(copy of those changes on nurse desk)  Please make those changes in her chart. And mail a new after visit summary to her tomorrow attached with her lab results. Any questions pls contact the patient. [phone]

## 2019-01-29 ENCOUNTER — Ambulatory Visit (INDEPENDENT_AMBULATORY_CARE_PROVIDER_SITE_OTHER): Payer: Medicare Other | Admitting: Internal Medicine

## 2019-01-29 ENCOUNTER — Other Ambulatory Visit (INDEPENDENT_AMBULATORY_CARE_PROVIDER_SITE_OTHER): Payer: Self-pay

## 2019-01-29 DIAGNOSIS — I10 Essential (primary) hypertension: Secondary | ICD-10-CM

## 2019-01-29 MED ORDER — VENLAFAXINE HCL ER 75 MG PO CP24
ORAL_CAPSULE | ORAL | 0 refills | Status: DC
Start: 2019-01-29 — End: 2020-03-16

## 2019-01-29 MED ORDER — DILTIAZEM HCL ER COATED BEADS 120 MG PO CP24
120.00 mg | ORAL_CAPSULE | Freq: Every day | ORAL | 0 refills | Status: DC
Start: 2019-01-29 — End: 2019-02-26

## 2019-01-29 NOTE — Telephone Encounter (Signed)
Last OV 07/29/2018.

## 2019-02-26 ENCOUNTER — Ambulatory Visit (FREE_STANDING_LABORATORY_FACILITY): Payer: Medicare Other | Admitting: Internal Medicine

## 2019-02-26 ENCOUNTER — Encounter (INDEPENDENT_AMBULATORY_CARE_PROVIDER_SITE_OTHER): Payer: Self-pay | Admitting: Internal Medicine

## 2019-02-26 VITALS — BP 120/78 | HR 94 | Temp 97.7°F | Resp 12 | Ht 62.5 in | Wt 110.2 lb

## 2019-02-26 DIAGNOSIS — R002 Palpitations: Secondary | ICD-10-CM

## 2019-02-26 DIAGNOSIS — E039 Hypothyroidism, unspecified: Secondary | ICD-10-CM

## 2019-02-26 DIAGNOSIS — E7800 Pure hypercholesterolemia, unspecified: Secondary | ICD-10-CM

## 2019-02-26 DIAGNOSIS — Z1212 Encounter for screening for malignant neoplasm of rectum: Secondary | ICD-10-CM

## 2019-02-26 DIAGNOSIS — Z1211 Encounter for screening for malignant neoplasm of colon: Secondary | ICD-10-CM

## 2019-02-26 DIAGNOSIS — R012 Other cardiac sounds: Secondary | ICD-10-CM

## 2019-02-26 DIAGNOSIS — I10 Essential (primary) hypertension: Secondary | ICD-10-CM

## 2019-02-26 LAB — COMPREHENSIVE METABOLIC PANEL
ALT: 21 U/L (ref 0–55)
AST (SGOT): 23 U/L (ref 5–34)
Albumin/Globulin Ratio: 2.1 (ref 0.9–2.2)
Albumin: 4.2 g/dL (ref 3.5–5.0)
Alkaline Phosphatase: 42 U/L (ref 37–106)
BUN: 9 mg/dL (ref 7.0–19.0)
Bilirubin, Total: 0.5 mg/dL (ref 0.2–1.2)
CO2: 30 mEq/L — ABNORMAL HIGH (ref 21–29)
Calcium: 9.1 mg/dL (ref 8.5–10.5)
Chloride: 99 mEq/L — ABNORMAL LOW (ref 100–111)
Creatinine: 0.8 mg/dL (ref 0.4–1.5)
Globulin: 2 g/dL (ref 2.0–3.7)
Glucose: 81 mg/dL (ref 70–100)
Potassium: 4.5 mEq/L (ref 3.5–5.1)
Protein, Total: 6.2 g/dL (ref 6.0–8.3)
Sodium: 137 mEq/L (ref 136–145)

## 2019-02-26 LAB — LIPID PANEL
Cholesterol / HDL Ratio: 2.8
Cholesterol: 236 mg/dL — ABNORMAL HIGH (ref 0–199)
HDL: 85 mg/dL (ref 40–9999)
LDL Calculated: 128 mg/dL — ABNORMAL HIGH (ref 0–99)
Triglycerides: 114 mg/dL (ref 34–149)
VLDL Calculated: 23 mg/dL (ref 10–40)

## 2019-02-26 LAB — T3, FREE: T3, Free: 2.73 pg/mL (ref 1.71–3.71)

## 2019-02-26 LAB — T4, FREE: T4 Free: 0.97 ng/dL (ref 0.70–1.48)

## 2019-02-26 LAB — TSH: TSH: 1.99 u[IU]/mL (ref 0.35–4.94)

## 2019-02-26 LAB — HEMOLYSIS INDEX: Hemolysis Index: 6 (ref 0–18)

## 2019-02-26 LAB — GFR: EGFR: >60

## 2019-02-26 MED ORDER — DILTIAZEM HCL ER COATED BEADS 120 MG PO CP24
120.00 mg | ORAL_CAPSULE | Freq: Every day | ORAL | 3 refills | Status: DC
Start: 2019-02-26 — End: 2019-10-15

## 2019-02-26 NOTE — Progress Notes (Signed)
Have you seen any specialists/other providers since your last visit with [place]?    Yes. Endocrinologist, Rheumatologist     Arm preference verified?   Yes    The patient is due for colonoscopy, falls risk screening, shingles vaccine and Hep C, [facility] care plan letter, Medicare annual wellness

## 2019-02-26 NOTE — Progress Notes (Signed)
[facility] PRIMARY CARE  [place]  PROGRESS NOTE      Patient: Katherine Mays   Date: 02/26/2019   MRN: [id number]     ASSESSMENT/PLAN     Keaira Whitehurst is a 68 y.o. female    Chief Complaint   Patient presents with    Hypertension    Hyperlipidemia    Hypothyroidism    Palpitations        1. Essential hypertension, benign    - ECG 12 lead NSR. Normal.  - Comprehensive metabolic panel  - Echocardiogram Adult Complete W Clr/ Dopp Waveform; Future  - dilTIAZem (CARDIZEM CD) 120 MG 24 hr capsule; Take 1 capsule (120 mg total) by mouth daily  Dispense: 90 capsule; Refill: 3    2. Palpitations  Heart rate is normal and EKG is normal.  Advised patient to get an echocardiogram test.  - ECG 12 lead NSR. Normal.  - TSH  - T4, free  - T3, free  - Echocardiogram Adult Complete W Clr/ Dopp Waveform; Future    3. Pure hypercholesterolemia  The risks and complications of dyslipidemia have been explained. Importance of low fat diet, and aerobic exercises have been explained.    Patient has a good ratio of bad cholesterol to good cholesterol.  HDL is high.  No need for medication.  Follow diet restrictions  - ECG 12 lead  - TSH  - Lipid panel  - T4, free  - T3, free  - Echocardiogram Adult Complete W Clr/ Dopp Waveform; Future    4. Hypothyroidism, unspecified type  Following labs are ordered to evaluate for current status of thyroid.  Palpitation may indicate elevated thyroid level.    - - ECG 12 lead NSR. Normal.  - Comprehensive metabolic panel  - TSH  - Lipid panel  - T4, free  - T3, free    5. Split S2 (second heart sound)  Echocardiogram is ordered.  - Echocardiogram Adult Complete W Clr/ Dopp Waveform; Future    6. Screening for colorectal cancer  Her last colonoscopy was in 2007.  Encouraged to get a screening colonoscopy test  - Ambulatory referral to Gastroenterology           MEDICATIONS     Current Outpatient Medications   Medication Sig Dispense Refill    dilTIAZem (CARDIZEM CD) 120 MG 24 hr capsule Take 1 capsule (120 mg  total) by mouth daily 90 capsule 3    levothyroxine (SYNTHROID) 75 MCG tablet Take 75 mcg by mouth Once a day at 6:00am Only 6 times a week        PROLIA 60 MG/ML Solution subcutaneous injection every 6 (six) months         traMADol (ULTRAM) 50 MG tablet Take 50 mg by mouth 2 (two) times daily      venlafaxine (EFFEXOR-XR) 75 MG 24 hr capsule Take 3 capsule daily. Total dose 225 mg (Patient taking differently: 225 mg daily Total dose 225 mg daily  ) 270 capsule 0     No current facility-administered medications for this visit.        Allergies   Allergen Reactions    Keflex [Cephalexin]        SUBJECTIVE     Chief Complaint   Patient presents with    Hypertension    Hyperlipidemia    Hypothyroidism    Palpitations        Hypertension   The patient is being seen for a routine follow-up of hypertension. Hypertension is  classified as chronic. There is no history of CHF and proteinuria. Comorbid illnesses include hyperlipidemia. There are no recent interval events.  Interval symptoms include palpitations.   There is no interval history of chest pain, dyspnea, exertional chest pressure/discomfort and lower extremity edema.  Current therapy includes calcium channel blockers. Patient is compliant with the current regimen.   The patient's blood pressure response to medications is at goal. Average ambulatory systolic blood pressure is generally in the 120's. Average ambulatory diastolic blood pressure is generally in the 70's.    Hyperlipidemia   The patient is being seen for a routine follow-up of hyperlipidemia. Hyperlipidemia is classified as pure hypercholesterolemia. Comorbid illnesses include hypertension.  There are no recent interval events. Current therapy includes lifestyle changes.  Patient is compliant with the current regimen.     Hypothyroidism   The patient is being seen for a routine follow-up of hypothyroidism. Hypothyroidism is classified as acquired. There are no known risk factors.  Comorbid  illnesses include hyperlipidemia.  There are no recent interval events. Interval symptoms include palpitations. Current therapy includes levothyroxine.  Patient is compliant with the current regimen.     Palpitations    This is a new problem. The current episode started 1 to 4 weeks ago. The problem occurs intermittently. The problem has been waxing and waning. Nothing aggravates the symptoms. Pertinent negatives include no chest pain, dizziness, fever, shortness of breath, syncope or weakness. She has tried nothing for the symptoms.           ROS     Review of Systems   Constitutional: Negative for fatigue, fever and unexpected weight change.   Respiratory: Negative for shortness of breath and wheezing.    Cardiovascular: Positive for palpitations. Negative for chest pain, leg swelling and syncope.   Neurological: Negative for dizziness, tremors, weakness, light-headedness and headaches.   All other systems reviewed and are negative.      Patient Active Problem List   Diagnosis    Senile osteoporosis    Chronic kidney disease    Depression    Essential hypertension, benign    Hypothyroidism    Chronic daily headache    Chronic migraine    Cervicalgia    Cervical spine disease    Sleep difficulties    Unrefreshed by sleep    Daytime sleepiness    History of depression       Past Medical History:   Diagnosis Date    Anemia     Broken foot     right foot    Chronic kidney disease, stage I 06/2013    Depression age 89     followed by Psychiatrist.     Disorder of thyroid     followed by Endocrinologist.     Osteoporosis, idiopathic            The following portions of the patient's history were reviewed and updated as appropriate: Allergies, Current Medications, Past Family History, Past Medical history, Past social history, Past surgical history, and Problem List.      PHYSICAL EXAM     Vitals:    02/26/19 0722 02/26/19 0723 02/26/19 0738   BP: 133/82 132/86 120/78   BP Site: Left arm Right arm  Right arm   Patient Position: Sitting Sitting Sitting   Cuff Size: Medium Medium Medium   Pulse: 94     Resp: 12     Temp: 97.7 F (36.5 C)     TempSrc: Oral  SpO2: 98%     Weight: 50 kg (110 lb 3.2 oz)     Height: 1.588 m (5' 2.5")           Physical Exam   Constitutional: She is oriented to person, place, and time. She appears well-developed and well-nourished.   HENT:   Right Ear: External ear normal.   Left Ear: External ear normal.   Nose: Nose normal.   Mouth/Throat: Oropharynx is clear and moist.   Eyes: Pupils are equal, round, and reactive to light. Conjunctivae and EOM are normal.   Neck: Normal range of motion. Neck supple. No JVD present. No thyromegaly present.   Cardiovascular: Normal rate, regular rhythm and S1 normal. Gallop:  Split S2 at left sternal border.   Murmur heard.   Systolic murmur is present with a grade of 1/6.  Pulmonary/Chest: Effort normal and breath sounds normal. No respiratory distress. She has no wheezes. She has no rales.   Abdominal: Soft. Bowel sounds are normal. She exhibits no distension. There is no abdominal tenderness.   Musculoskeletal: Normal range of motion.         General: No edema.   Neurological: She is alert and oriented to person, place, and time. No cranial nerve deficit. Coordination normal.   Skin: Skin is warm and dry.   Psychiatric: She has a normal mood and affect.   Vitals reviewed.            Signed,  Clydell Hakim, MD  02/26/2019

## 2019-03-12 ENCOUNTER — Telehealth (INDEPENDENT_AMBULATORY_CARE_PROVIDER_SITE_OTHER): Payer: Self-pay | Admitting: Internal Medicine

## 2019-03-12 NOTE — Telephone Encounter (Signed)
Patient called regarding recent results for blood test. Please return call at noted phone number below.      Patient Preferred Callback Number: [phone]

## 2019-03-12 NOTE — Telephone Encounter (Signed)
Lm to call back. Also results were sent to mychart.

## 2019-09-16 ENCOUNTER — Encounter (INDEPENDENT_AMBULATORY_CARE_PROVIDER_SITE_OTHER): Payer: Self-pay

## 2019-09-28 ENCOUNTER — Ambulatory Visit (INDEPENDENT_AMBULATORY_CARE_PROVIDER_SITE_OTHER): Payer: Medicare Other | Admitting: Internal Medicine

## 2019-10-05 ENCOUNTER — Telehealth (INDEPENDENT_AMBULATORY_CARE_PROVIDER_SITE_OTHER): Payer: Self-pay | Admitting: Internal Medicine

## 2019-10-05 NOTE — Telephone Encounter (Signed)
Patient needs updated order for EKG sent into her chart, she has an appointment on 11/6 and Dr. Allena Katz advising to get imaging done prior. She would like to come into the office tp ick up this order tomorrow. Please return call at noted phone number below.      Patient Preferred Callback Number: [phone]

## 2019-10-06 ENCOUNTER — Other Ambulatory Visit (INDEPENDENT_AMBULATORY_CARE_PROVIDER_SITE_OTHER): Payer: Self-pay | Admitting: Internal Medicine

## 2019-10-06 ENCOUNTER — Telehealth (INDEPENDENT_AMBULATORY_CARE_PROVIDER_SITE_OTHER): Payer: Self-pay | Admitting: Internal Medicine

## 2019-10-06 DIAGNOSIS — R002 Palpitations: Secondary | ICD-10-CM

## 2019-10-06 DIAGNOSIS — I10 Essential (primary) hypertension: Secondary | ICD-10-CM

## 2019-10-06 DIAGNOSIS — E7800 Pure hypercholesterolemia, unspecified: Secondary | ICD-10-CM

## 2019-10-06 DIAGNOSIS — R012 Other cardiac sounds: Secondary | ICD-10-CM

## 2019-10-06 NOTE — Telephone Encounter (Signed)
Left VM informing pt echo order is ready for pick up

## 2019-10-06 NOTE — Telephone Encounter (Signed)
Pt return phone call to Nurse Johney Maine. Pt confirmed that the order pt is looking for is the expired echocardiogram     Pt phone# [phone]

## 2019-10-06 NOTE — Telephone Encounter (Signed)
Pt would like for you to reorder the echocardiogram. The one on epic has expired. Please advise. Per patient, she will pick it up when order is ready.

## 2019-10-06 NOTE — Telephone Encounter (Signed)
Left VM to clarify order requested. The EKG was done here in the office when pt was last seen. Dr. Allena Katz order an echocardiogram future (order expired). Is this the order pt requested?

## 2019-10-06 NOTE — Telephone Encounter (Signed)
Echocardiogram is ordered again.

## 2019-10-14 ENCOUNTER — Encounter (INDEPENDENT_AMBULATORY_CARE_PROVIDER_SITE_OTHER): Payer: Self-pay

## 2019-10-15 ENCOUNTER — Encounter (INDEPENDENT_AMBULATORY_CARE_PROVIDER_SITE_OTHER): Payer: Self-pay | Admitting: Internal Medicine

## 2019-10-15 ENCOUNTER — Ambulatory Visit (FREE_STANDING_LABORATORY_FACILITY): Payer: Medicare Other | Admitting: Internal Medicine

## 2019-10-15 VITALS — BP 128/80 | HR 85 | Temp 98.1°F | Resp 12 | Ht 62.5 in | Wt 110.2 lb

## 2019-10-15 DIAGNOSIS — E039 Hypothyroidism, unspecified: Secondary | ICD-10-CM

## 2019-10-15 DIAGNOSIS — Z0000 Encounter for general adult medical examination without abnormal findings: Secondary | ICD-10-CM

## 2019-10-15 DIAGNOSIS — F418 Other specified anxiety disorders: Secondary | ICD-10-CM

## 2019-10-15 DIAGNOSIS — I129 Hypertensive chronic kidney disease with stage 1 through stage 4 chronic kidney disease, or unspecified chronic kidney disease: Secondary | ICD-10-CM

## 2019-10-15 DIAGNOSIS — Z1159 Encounter for screening for other viral diseases: Secondary | ICD-10-CM

## 2019-10-15 DIAGNOSIS — N181 Chronic kidney disease, stage 1: Secondary | ICD-10-CM

## 2019-10-15 DIAGNOSIS — E7800 Pure hypercholesterolemia, unspecified: Secondary | ICD-10-CM

## 2019-10-15 DIAGNOSIS — M810 Age-related osteoporosis without current pathological fracture: Secondary | ICD-10-CM

## 2019-10-15 DIAGNOSIS — Z1231 Encounter for screening mammogram for malignant neoplasm of breast: Secondary | ICD-10-CM

## 2019-10-15 DIAGNOSIS — I10 Essential (primary) hypertension: Secondary | ICD-10-CM

## 2019-10-15 DIAGNOSIS — Z681 Body mass index (BMI) 19 or less, adult: Secondary | ICD-10-CM

## 2019-10-15 LAB — CBC AND DIFFERENTIAL
Absolute NRBC: 0 10*3/uL (ref 0.00–0.00)
Basophils Absolute Automated: 0.04 10*3/uL (ref 0.00–0.08)
Basophils Automated: 0.8 %
Eosinophils Absolute Automated: 0.3 10*3/uL (ref 0.00–0.44)
Eosinophils Automated: 5.8 %
Hematocrit: 41.8 % (ref 34.7–43.7)
Hgb: 12.9 g/dL (ref 11.4–14.8)
Immature Granulocytes Absolute: 0.02 10*3/uL (ref 0.00–0.07)
Immature Granulocytes: 0.4 %
Lymphocytes Absolute Automated: 1.46 10*3/uL (ref 0.42–3.22)
Lymphocytes Automated: 28.2 %
MCH: 30.6 pg (ref 25.1–33.5)
MCHC: 30.9 g/dL — ABNORMAL LOW (ref 31.5–35.8)
MCV: 99.3 fL — ABNORMAL HIGH (ref 78.0–96.0)
MPV: 8.8 fL — ABNORMAL LOW (ref 8.9–12.5)
Monocytes Absolute Automated: 0.65 10*3/uL (ref 0.21–0.85)
Monocytes: 12.6 %
Neutrophils Absolute: 2.7 10*3/uL (ref 1.10–6.33)
Neutrophils: 52.2 %
Nucleated RBC: 0 /100 WBC (ref 0.0–0.0)
Platelets: 322 10*3/uL (ref 142–346)
RBC: 4.21 10*6/uL (ref 3.90–5.10)
RDW: 13 % (ref 11–15)
WBC: 5.17 10*3/uL (ref 3.10–9.50)

## 2019-10-15 LAB — COMPREHENSIVE METABOLIC PANEL
ALT: 11 U/L (ref 0–55)
AST (SGOT): 15 U/L (ref 5–34)
Albumin/Globulin Ratio: 2.2 (ref 0.9–2.2)
Albumin: 4.2 g/dL (ref 3.5–5.0)
Alkaline Phosphatase: 41 U/L (ref 37–106)
Anion Gap: 10 (ref 5.0–15.0)
BUN: 13 mg/dL (ref 7.0–19.0)
Bilirubin, Total: 0.4 mg/dL (ref 0.2–1.2)
CO2: 31 mEq/L — ABNORMAL HIGH (ref 21–29)
Calcium: 9.1 mg/dL (ref 8.5–10.5)
Chloride: 98 mEq/L — ABNORMAL LOW (ref 100–111)
Creatinine: 0.9 mg/dL (ref 0.4–1.5)
Globulin: 1.9 g/dL — ABNORMAL LOW (ref 2.0–3.7)
Glucose: 100 mg/dL (ref 70–100)
Potassium: 4.3 mEq/L (ref 3.5–5.1)
Protein, Total: 6.1 g/dL (ref 6.0–8.3)
Sodium: 139 mEq/L (ref 136–145)

## 2019-10-15 LAB — LIPID PANEL
Cholesterol / HDL Ratio: 2.5
Cholesterol: 226 mg/dL — ABNORMAL HIGH (ref 0–199)
HDL: 92 mg/dL (ref 40–9999)
LDL Calculated: 123 mg/dL — ABNORMAL HIGH (ref 0–99)
Triglycerides: 54 mg/dL (ref 34–149)
VLDL Calculated: 11 mg/dL (ref 10–40)

## 2019-10-15 LAB — HEMOLYSIS INDEX: Hemolysis Index: 4 (ref 0–18)

## 2019-10-15 LAB — HEPATITIS C ANTIBODY: Hepatitis C, AB: NONREACTIVE

## 2019-10-15 LAB — GFR: EGFR: >60

## 2019-10-15 LAB — T4, FREE: T4 Free: 0.99 ng/dL (ref 0.70–1.48)

## 2019-10-15 LAB — TSH: TSH: 1.76 u[IU]/mL (ref 0.35–4.94)

## 2019-10-15 MED ORDER — DILTIAZEM HCL ER COATED BEADS 180 MG PO CP24
180.00 mg | ORAL_CAPSULE | Freq: Every day | ORAL | 3 refills | Status: AC
Start: 2019-10-15 — End: 2020-04-13

## 2019-10-15 NOTE — Patient Instructions (Signed)
Womens Preventive Wellness Plan  Todays Date: October 15, 2019    Patient Name:Katherine Mays    Date of Birth: [birth year]     As part of your wellness benefit, Medicare makes many screening tests available to you at no charge.  A complete list of these tests can be found at their website, [employer]. However, many of these tests or recommendations are out of date, or may not apply to you. After careful consideration of your own personal health needs, the following testing is recommended for you:      Preventive Service    Up-to-date (UTD)/Due/Not Applicable (N/A)   Last Done   Medicare Frequency   Body Mass Index   Up-to-date October 15, 2019  (BMI):Body mass index is 19.83 kg/m.   Height:Height: 158.8 cm (5' 2.5")  Weight:Weight: 50 kg (110 lb 3.2 oz)  Annually   Blood Pressure: Up-to-date October 15, 2019    BP: 128/80    Every 2 yrs, if BP </= 120/80 mm hg   Annually, if BP >120-139/80-89 mm hg   Cholesterol Testing Due Lab Results   Component Value Date    LDL 128 (H) 02/26/2019      Regularly beginning at age 17 with risk factors   Diabetes Screening Due Lab Results   Component Value Date    GLU 81 02/26/2019       If prediabetes, one screening every 6 months   Otherwise, one screening every 12 months with certain risk factors for diabetes   Osteoporosis Screening   (Bone Density Measurement)  Up-to-date   Routinely, for women aged 65+   Routinely, for women aged 60-64 with risk factors   Breast Cancer Screening   (Mammogram) Up-to-date   Every 2 yrs, aged 73-74 yrs   Cervical Cancer Screening   (Pap Smear)  Not applicable   Annually if at high risk for developing cervical or vaginal cancer or childbearing age with abnormal Pap test within past 3 years; Every 2 years for women at normal risk   HPV: Once every 5 years; All asymptomatic female Medicare beneficiaries aged 10 to 61 years   Colorectal Cancer Screening Up-to-date    Annually, Fecal Occult Blood Stool (FOBS)   Every 5 yrs, Sigmoidoscopy with FOBS   Every 10 yrs, Colonoscopy   Every 3 yrs, Cologuard   Depression Screening Up-to-date October 15, 2019   As necessary for those with risk factors   Sexually Transmitted Diseases (STDs) & HIV Screening Not applicable   As necessary for those with risk factors   Alcohol Misuse Screening Not applicable   As necessary for those with risk factors   Immunizations:   Up-to-date Immunization History   Administered Date(s) Administered    INFLUENZA HIGH DOSE 65 YRS+ 09/08/2014, 11/08/2018, 10/04/2019    Influenza (Im) Preserved TRIVALENT VACCINE 10/01/2013    Influenza Quad Im 6-35 Mos preserved 09/09/2015, 09/08/2016    Influenza quadrivalent (IM) 6 months & up PRESERVED 08/09/2010, 10/12/2014    Pneumococcal 23 valent 10/21/2016    Pneumococcal Conjugate 13-Valent 10/10/2015    Tdap 10/01/2013    Zoster (SHINGRIX) Vaccine Recombinant 10/12/2019    Zoster (ZOSTAVAX) Vaccine 10/01/2013     Prevnar 13: 1 dose after age 33   Pneumovax 23: 1 dose 1 year after Prevnar   Influenza: Annually   Advance Directive Up-to-date   Once; update as needed   Medical Nutrition Therapy Up-to-date   As necessary for diabetes or renal disease   Smoking Cessation Counseling Not  applicable Counseling given: Not Answered    Frequency: two cessation attempts per year.   Glaucoma Screening Up-to-date   Annually for covered high risk Medicare beneficiaries (one of the following: DM, FHx Glaucoma, African-Americans aged 45+, Hispanic-Americans aged 65+)   Hepatitis C Virus (HCV) Screening Due   Annually only for high risk behavior   Once if born between [birth year] and 1966 and are not considered high risk   Lung Cancer Screening Not applicable   Annually if asymptomatic, tobacco smoking history of at least 30 pack-years (one pack-year = smoking one pack per day for one year; 1 pack = 20 cigarettes), and current smoker or one who has quit smoking  within the last 15 years     Your major risk factors:       Hypertension and Osteoporosis     Recommendations for improvement:    Low cholesterol diet, Exercise, Low carb diet and Weight bearing exercises     Referrals:    See After Visit Summary orders

## 2019-10-15 NOTE — Progress Notes (Signed)
Have you seen any specialists/other providers since your last visit with [place]?    Yes. Rheumatologist    Arm preference verified?   Yes    The patient is due for mammogram, colonoscopy and Hep C, Advance directive on file

## 2019-10-15 NOTE — Progress Notes (Signed)
Katherine Mays is a 68 y.o. female who presents today for a Medicare Annual Wellness Visit.     Health Risk Assessment     During the past month, how would you rate your general health?:  Fair  Which of the following tasks can you do without assistance - drive or take the bus alone; shop for groceries or clothes; prepare your own meals; do your own housework/laundry; handle your own finances/pay bills; eat, bathe or get around your home?:  Drive or take the bus alone, Shop for groceries or clothes, Prepare your own meals, Do your own housework/laundry, Handle your own finances/pay bills, Eat, bathe, dress or get around your home  Which of the following problems have you been bothered by in the past month - dizzy when standing up; problems using the phone; feeling tired or fatigued; moderate or severe body pain?: Dizzy when standing up, Feeling tired or fatigued, Moderate or severe body pain  Do you exercise for about 20 minutes 3 or more days per week?:  Yes  During the past month was someone available to help if you needed and wanted help?  For example, if you felt nervous, lonely, got sick and had to stay in bed, needed someone to talk to, needed help with daily chores or needed help just taking care of yourself.: Yes  Do you always wear a seat belt?: Yes  Do you have any trouble taking medications the way you have been told to take them?: No  Have you been given any information that can help you with keeping track of your medications?: No  Do you have trouble paying for your medications?: No  Have you been given any information that can help you with hazards in your house, such as scatter rugs, furniture, etc?: No  Do you feel unsteady when standing or walking?: No  Do you worry about falling?: No  Have you fallen two or more times in the past year?: No  Did you suffer any injuries from your falls in the past year?: No    Additional Concerns    Patient Care Team:  Clydell Hakim, MD as PCP - General (Internal  Medicine)  Eddie North, MD as Consulting Physician (Nephrology)    Past Medical History:   Diagnosis Date    Anemia     Broken foot     right foot    Chronic kidney disease, stage I 06/2013    Depression age 48     followed by Psychiatrist.     Disorder of thyroid     followed by Endocrinologist.     Osteoporosis, idiopathic      Past Surgical History:   Procedure Laterality Date    CESAREAN SECTION      four    HYSTERECTOMY  1995    NECK SURGERY      TONSILLECTOMY      WISDOM TOOTH EXTRACTION       Allergies   Allergen Reactions    Keflex [Cephalexin]       Current Outpatient Medications   Medication Sig Dispense Refill    CALCIUM-VITAMIN D PO Take by mouth 2 (two) times daily      levothyroxine (SYNTHROID) 75 MCG tablet Take 75 mcg by mouth Once a day at 6:00am Only 6 times a week        PROLIA 60 MG/ML Solution subcutaneous injection every 6 (six) months         traMADol (ULTRAM) 50 MG tablet Take 50  mg by mouth 2 (two) times daily      venlafaxine (EFFEXOR-XR) 75 MG 24 hr capsule Take 3 capsule daily. Total dose 225 mg (Patient taking differently: 225 mg daily Total dose 225 mg daily  ) 270 capsule 0    desvenlafaxine (PRISTIQ) 100 MG 24 hr tablet Take 100 mg by mouth daily      dilTIAZem (CARDIZEM CD) 180 MG 24 hr capsule Take 1 capsule (180 mg total) by mouth daily 90 capsule 3     No current facility-administered medications for this visit.       Social History     Tobacco Use    Smoking status: Never Smoker    Smokeless tobacco: Never Used   Substance Use Topics    Alcohol use: Yes     Comment: 5-6 drinks per week    Drug use: No      Family History   Problem Relation Age of Onset    Thyroid disease Mother     Diabetes Father     Heart disease Father 19        M I     Osteoporosis Father     Stroke Father         The following sections were reviewed this encounter by the provider:   Tobacco   Allergies   Meds   Problems   Med Hx   Surg Hx   Fam Hx   Soc Hx           Hospitalizations  no hospitalizations within past 6 months    Depression Screening Negative     See related Activity or Flowsheet    Functional Ability    Falls Risk:  home does not have throw rugs, poor lighting or a slippery bath tub or shower, < or = 1 fall within past year and timed "Get Up and Go Evaluation" < 20 seconds  Hearing:  hearing within normal limits  Exercise:  Light ( i.e. stretching or slow walking ) and Moderate ( i.e. brisk walking )  ADL's:   Bathing - independent   Dressing - independent   Mobility - independent   Transfer - independent   Eating - independent}   Toileting - independent   ADL assistance not needed    Discussion of Advance Directives: Has no Advanced Directive. Form provided.     Assessment    BP 128/80 (BP Site: Right arm, Patient Position: Sitting, Cuff Size: Medium)    Pulse 85    Temp 98.1 F (36.7 C) (Oral)    Resp 12    Ht 1.588 m (5' 2.5")    Wt 50 kg (110 lb 3.2 oz)    SpO2 99%    BMI 19.83 kg/m      Vision Screening (required for IPPE only): Corrected:     L 20/25     R 20/25  (NOTE: Vision screening may NOT be deferred for Welcome to Medicare visit)    Screening EKG (IPPE only): not indicated    Physical Examination: General appearance - alert, well appearing, and in no distress  Mental status - alert, oriented to person, place, and time  Chest - clear to auscultation, no wheezes, rales or rhonchi, symmetric air entry  Heart - normal rate, regular rhythm, normal S1, S2, no murmurs, rubs, clicks or gallops  Abdomen - soft, nontender, nondistended, no masses or organomegaly  Neurological - alert, oriented, normal speech, no focal findings or movement disorder noted  Musculoskeletal -  no joint tenderness, deformity or swelling  Extremities - peripheral pulses normal, no pedal edema, no clubbing or cyanosis  Skin - normal coloration and turgor, no rashes, no suspicious skin lesions noted      Evaluation of Cognitive Function    Mood/affect: Appropriate  Appearance:  neatly groomed, appropriately and adequately nourished  Family member/caregiver input: Not present        AWV Mini-Cog Result:  3 recalled words - negative screen for dementia    Assessment/Plan    1. Medicare annual wellness visit, subsequent    2. Encounter for screening mammogram for malignant neoplasm of breast    3. Need for hepatitis C screening test  - Hepatitis C (HCV) antibody, Total    4. Essential hypertension, benign  - Comprehensive metabolic panel  - CBC and differential  - dilTIAZem (CARDIZEM CD) 180 MG 24 hr capsule; Take 1 capsule (180 mg total) by mouth daily  Dispense: 90 capsule; Refill: 3    5. Pure hypercholesterolemia  - Comprehensive metabolic panel  - Lipid panel    6. Senile osteoporosis    7. Stage 1 chronic kidney disease  - Comprehensive metabolic panel  - CBC and differential    8. Acquired hypothyroidism  - TSH  - T4, free    9. Depression with anxiety       Clydell Hakim, MD   10/15/2019

## 2019-10-17 ENCOUNTER — Encounter (INDEPENDENT_AMBULATORY_CARE_PROVIDER_SITE_OTHER): Payer: Self-pay

## 2019-10-29 ENCOUNTER — Other Ambulatory Visit: Payer: Self-pay | Admitting: Family Medicine

## 2019-11-01 ENCOUNTER — Encounter (INDEPENDENT_AMBULATORY_CARE_PROVIDER_SITE_OTHER): Payer: Self-pay | Admitting: Internal Medicine

## 2019-11-09 ENCOUNTER — Encounter (INDEPENDENT_AMBULATORY_CARE_PROVIDER_SITE_OTHER): Payer: Self-pay

## 2019-11-09 ENCOUNTER — Telehealth (INDEPENDENT_AMBULATORY_CARE_PROVIDER_SITE_OTHER): Payer: Medicare Other | Admitting: Vascular Neurology

## 2019-11-09 ENCOUNTER — Encounter (INDEPENDENT_AMBULATORY_CARE_PROVIDER_SITE_OTHER): Payer: Self-pay | Admitting: Vascular Neurology

## 2019-11-22 ENCOUNTER — Ambulatory Visit: Payer: Medicare Other

## 2019-12-28 ENCOUNTER — Ambulatory Visit
Admission: RE | Admit: 2019-12-28 | Discharge: 2019-12-28 | Disposition: A | Discharge status: Home / Self Care | Payer: Medicare Other | Source: Ambulatory Visit | Attending: Internal Medicine | Admitting: Internal Medicine

## 2019-12-28 ENCOUNTER — Ambulatory Visit (INDEPENDENT_AMBULATORY_CARE_PROVIDER_SITE_OTHER): Payer: Self-pay

## 2019-12-28 DIAGNOSIS — I253 Aneurysm of heart: Secondary | ICD-10-CM | POA: Insufficient documentation

## 2019-12-28 DIAGNOSIS — E7800 Pure hypercholesterolemia, unspecified: Secondary | ICD-10-CM

## 2019-12-28 DIAGNOSIS — R002 Palpitations: Secondary | ICD-10-CM | POA: Insufficient documentation

## 2019-12-28 DIAGNOSIS — I082 Rheumatic disorders of both aortic and tricuspid valves: Secondary | ICD-10-CM | POA: Insufficient documentation

## 2019-12-28 DIAGNOSIS — I10 Essential (primary) hypertension: Secondary | ICD-10-CM | POA: Insufficient documentation

## 2019-12-28 DIAGNOSIS — R012 Other cardiac sounds: Secondary | ICD-10-CM

## 2019-12-29 ENCOUNTER — Encounter (INDEPENDENT_AMBULATORY_CARE_PROVIDER_SITE_OTHER): Payer: Self-pay

## 2019-12-31 ENCOUNTER — Encounter (INDEPENDENT_AMBULATORY_CARE_PROVIDER_SITE_OTHER): Payer: Self-pay | Admitting: Internal Medicine

## 2020-01-06 NOTE — Pre-Procedure Instructions (Signed)
Msg left on voicemail for patient re: covid test-needs to be done 72-96 hours prior to the time of procedure.

## 2020-01-07 ENCOUNTER — Ambulatory Visit (INDEPENDENT_AMBULATORY_CARE_PROVIDER_SITE_OTHER): Payer: Medicare Other

## 2020-01-07 ENCOUNTER — Telehealth: Payer: Self-pay

## 2020-01-07 ENCOUNTER — Encounter: Payer: Self-pay | Admitting: Gastroenterology

## 2020-01-07 DIAGNOSIS — K30 Functional dyspepsia: Secondary | ICD-10-CM

## 2020-01-07 DIAGNOSIS — Z01812 Encounter for preprocedural laboratory examination: Secondary | ICD-10-CM

## 2020-01-07 DIAGNOSIS — Z20822 Contact with and (suspected) exposure to covid-19: Secondary | ICD-10-CM

## 2020-01-07 HISTORY — DX: Functional dyspepsia: K30

## 2020-01-07 NOTE — Pre-Procedure Instructions (Addendum)
•   Surgical Risk Level : (Low, Intermediate, High)  o LOW     Surgeon Testing Requirements:  o COVID 19     Anesthesia Guideline Requirements:  o COVID 19     Specialist Notes / Test Results / Records Requested:  o COVID 19: 01/07/2020 pending results at this time.  o Echo 12/28/2019 EF 70% in Epic  o EKG 02/2019 WNL in Epic.  o PCP LOV 10/25/2019 Epic Notes.  o    Recent Hospitalization / ED Visit:   o none     Future Plan / Upcoming Appts:   o none     Labs/Testing @ [facility]:   o none     Email Sent To Patient:   o none  o    Faxes Sent To:   o none     Epic Orders Entered:   o DOS Anesthesia orders entered     Other Outlying information gathered that does not fit anywhere else  o Pt concerned about the expected weather (snow). Pt instructed to contact Dr. Irving Shows office directly for any cancellation.     Chart Room Handoff for Further  Follow-up if Applicable:           COVID result           NPO instructions provided:            NPO per Surgeon's bowel preop instructions.     Current Covid 19 hospital precautions reviewed with pt. Pt verbalizes understanding and does not have any further questions at this time.

## 2020-01-08 LAB — COVID-19 (SARS-COV-2): SARS CoV 2 Overall Result: NOT DETECTED

## 2020-01-09 ENCOUNTER — Encounter (INDEPENDENT_AMBULATORY_CARE_PROVIDER_SITE_OTHER): Payer: Self-pay | Admitting: Internal Medicine

## 2020-01-10 ENCOUNTER — Ambulatory Visit: Admission: RE | Admit: 2020-01-10 | Payer: Medicare Other | Source: Ambulatory Visit | Admitting: Gastroenterology

## 2020-01-10 ENCOUNTER — Encounter: Admission: RE | Payer: Self-pay | Source: Ambulatory Visit

## 2020-01-10 SURGERY — EGD, COLONOSCOPY
Anesthesia: Monitor Anesthesia Care | Site: Abdomen

## 2020-01-10 NOTE — Pre-Procedure Instructions (Addendum)
Pt left VM Sunday @ 1007 statingshe is cancelling his procedure due to the weather and ride issues. Pt also states she has tried numerous times to reach Dr. Faylene Million to notify him/ his office to notify them and no one has called her back yet.  Pt called back/ VMML to confirm msg receipt of cancellation.   [facility] Drone called to notify @ [phone].

## 2020-02-10 ENCOUNTER — Other Ambulatory Visit (INDEPENDENT_AMBULATORY_CARE_PROVIDER_SITE_OTHER): Payer: Self-pay | Admitting: Internal Medicine

## 2020-02-14 ENCOUNTER — Encounter (INDEPENDENT_AMBULATORY_CARE_PROVIDER_SITE_OTHER): Payer: Self-pay

## 2020-03-05 ENCOUNTER — Encounter (INDEPENDENT_AMBULATORY_CARE_PROVIDER_SITE_OTHER): Payer: Self-pay

## 2020-03-08 ENCOUNTER — Ambulatory Visit: Payer: Medicare Other

## 2020-03-10 ENCOUNTER — Encounter: Payer: Self-pay | Admitting: Anesthesiology

## 2020-03-10 NOTE — Pre-Procedure Instructions (Signed)
Per Scheduler Note 03/08/2020 pt states she wants to cancel procedure.  [facility] office emailed @ [phone] to confirm with [facility].

## 2020-03-13 ENCOUNTER — Ambulatory Visit: Admission: RE | Admit: 2020-03-13 | Payer: Medicare Other | Source: Ambulatory Visit | Admitting: Gastroenterology

## 2020-03-13 ENCOUNTER — Encounter: Admission: RE | Payer: Self-pay | Source: Ambulatory Visit

## 2020-03-13 SURGERY — EGD, COLONOSCOPY
Anesthesia: Monitor Anesthesia Care | Site: Abdomen

## 2020-03-13 NOTE — Pre-Procedure Instructions (Signed)
Pt left VM [phone] stating she postponed this DOS for 1 week with Dr Irving Shows office and has not been able to reach their office back again last week. She states he has been getting calls from [facility] and doesn't understand why the DOS has not been changed. She states she has been calling Dr. Irving Shows office and has not been able to speak with anyone this last week. Pt states she is no coming to [facility] today.  [facility]/ Steward Drone called- pt is not coming to [facility] today.  Pt called back/ VMML- msg received that she is not coming to [facility] today and encouraged to continue to reach Dr. Irving Shows office to get DOS rescheduled. Pt encouraged to call back to [facility] for any further questions.

## 2020-03-15 ENCOUNTER — Encounter (INDEPENDENT_AMBULATORY_CARE_PROVIDER_SITE_OTHER): Payer: Self-pay | Admitting: Vascular Neurology

## 2020-03-16 ENCOUNTER — Encounter (INDEPENDENT_AMBULATORY_CARE_PROVIDER_SITE_OTHER): Payer: Self-pay | Admitting: Vascular Neurology

## 2020-03-16 ENCOUNTER — Telehealth (INDEPENDENT_AMBULATORY_CARE_PROVIDER_SITE_OTHER): Payer: Medicare Other | Admitting: Vascular Neurology

## 2020-03-16 VITALS — Ht 63.0 in | Wt 112.0 lb

## 2020-03-16 DIAGNOSIS — M489 Spondylopathy, unspecified: Secondary | ICD-10-CM

## 2020-03-16 DIAGNOSIS — R519 Headache, unspecified: Secondary | ICD-10-CM

## 2020-03-16 DIAGNOSIS — G8929 Other chronic pain: Secondary | ICD-10-CM

## 2020-03-16 DIAGNOSIS — G478 Other sleep disorders: Secondary | ICD-10-CM

## 2020-03-16 DIAGNOSIS — M542 Cervicalgia: Secondary | ICD-10-CM

## 2020-03-16 DIAGNOSIS — IMO0002 Reserved for concepts with insufficient information to code with codable children: Secondary | ICD-10-CM

## 2020-03-16 DIAGNOSIS — R400 Somnolence: Secondary | ICD-10-CM

## 2020-03-16 DIAGNOSIS — Z79899 Other long term (current) drug therapy: Secondary | ICD-10-CM

## 2020-03-16 DIAGNOSIS — G43709 Chronic migraine without aura, not intractable, without status migrainosus: Secondary | ICD-10-CM

## 2020-03-16 DIAGNOSIS — M545 Low back pain: Secondary | ICD-10-CM

## 2020-03-16 DIAGNOSIS — G479 Sleep disorder, unspecified: Secondary | ICD-10-CM

## 2020-03-16 NOTE — Progress Notes (Signed)
TELEMEDICINE APPOINTMENT    Subjective:      Verbal consent was obtained from the patient for this telemedicine appointment, secondary to ongoing concerns regarding the COVID-19 pandemic.    Patient ID: Katherine Mays  CC:   Chief Complaint   Patient presents with    Headache    Back Pain    Neck Pain       HPI  The patient was last seen in June 2018.  She was previously recommended to have sleep study but had not schedule it since then.    She reports continuous back pain, more on the right side.  She tried physical therapy but it did not help.  She has had back pain for 35 years, and reports that she had developed this after doing a lot of yard work and never has been the same.  She also has neck pain that is worse with working out with crunches.    Her headaches are a little more under control, less frequent lately.  They had been "totally debilitating" up until this year but she does not identify a clear pattern.  They are migrainous with nausea, photophobia and phonophobia.  However they can occur at any point on her head or under different conditions.  Sometimes it feels like she is being stabbed in the head with an ice pick.  Occasionally she feels like she is going to explode and her head feels heavy.    She had been taking Effexor up until about a month or 2 ago and this was changed to Pristiq.    She does not sleep well, and previously used Ambien but not on it now for the last 2 years.  She still tired during the day but not quite as bad as earlier this year.  She feels like she needs to take a nap during the day and is still unrefreshed in the morning.      Other medications noted per chart review that the patient has been on include Seroquel, nortriptyline, lamotrigine, Fioricet.    Objective:     Vitals: Reviewed.   Visit Vitals  Ht 1.6 m (5\' 3" )   Wt 50.8 kg (112 lb)   BMI 19.84 kg/m     Respiratory rate appears normal.    General: WDWN, NAD. Vital signs reviewed.  HEENT: No nasal discharges, no  obvious oral masses. No icterus or conjunctival hemorrhage noted.  Neck: Full ROM. No neck masses noted.  Skin: No obvious rashes or discoloration.  Psychiatric: Cooperative with exam, normal affect. Thought process and speech pattern normal.    NEUROLOGICAL EXAM    Mental Status: Awake, alert, oriented. Speech fluent without dysarthria. Follows commands well. Attention, memory, and concentration intact. Fund of knowledge appropriate for education.     Cranial Nerves: Extraocular movements are full. No nystagmus seen. Facial sensation to light touch appears intact. Face is symmetric. Hearing is intact to conversational speech. Tongue protrudes midline. Cranial nerves II-XII otherwise appears intact.     Motor: Moves all extremities well, without UE drift. No obvious focal weakness noted. Bulk appears normal. No tremor noted.    Sensation: Light touch appears intact.     Coordination: No obvious dysmetria.     Gait: Normal, steady      Assessment:       1. Chronic migrainechronic condition, better control   2. Unrefreshed by sleepchronic severe condition   3. Chronic daily headachechronic condition   4. Cervicalgia    5. Cervical spine  diseasechronic condition   6. Sleep difficultieschronic severe condition   7. Daytime sleepinesschronic severe condition   8. Chronic right-sided low back pain without sciatica    9. Medication management        Plan:      1.  Trial of gabapentin, titrate up to 600 mg twice daily.  Side effects discussed.  2.  Referral for physical therapy.  3.  Check polysomnogram, given the unrefreshing sleep, sleep difficulties and daytime sleepiness, probably contributes to headaches.  4.  She will get me the results of her prior MRI brain for review.  5.  On Pristiq.  6.  May consider Botox in the future, although she indicated that for personal/family reasons she will be leaving for [place] at the end of May and probably be there indefinitely.  It makes more sense for her to start that once  she gets down there.  7.  Could also consider changing the diltiazem to verapamil, to address her blood pressure but also potentially address the vascular type headache she describes.  8.  Follow-up in 4 to 6 weeks.      The patient will return for follow-up as outlined above. If there are any problems with medications (if prescribed) or questions about any available test results, or if there are any new symptoms or changes in current symptoms, the patient is instructed to call the office.         Kadajah Kjos B. Stacy Gardner, MD  Director, [facility]  Co-Director, [facility] - [facility] Neurology  Board Certified, Sleep Medicine  Board Certified, Clinical Neurophysiology  Board Certified, Vascular Neurology        *This note was generated by the Epic EMR system/ Dragon speech recognition and may contain inherent errors or omissions not intended by the user. Grammatical errors, random word insertions, deletions, pronoun errors and incomplete sentences are occasional consequences of this technology due to software limitations. Not all errors are caught or corrected. If there are questions or concerns about the content of this note or information contained within the body of this dictation they should be addressed directly with the author for clarification.*

## 2020-03-21 DIAGNOSIS — D099 Carcinoma in situ, unspecified: Secondary | ICD-10-CM

## 2020-03-21 HISTORY — DX: Carcinoma in situ, unspecified: D09.9

## 2020-03-23 ENCOUNTER — Ambulatory Visit (INDEPENDENT_AMBULATORY_CARE_PROVIDER_SITE_OTHER): Payer: Medicare Other

## 2020-03-23 DIAGNOSIS — Z23 Encounter for immunization: Secondary | ICD-10-CM

## 2020-03-26 DIAGNOSIS — Z79899 Other long term (current) drug therapy: Secondary | ICD-10-CM | POA: Insufficient documentation

## 2020-03-26 MED ORDER — GABAPENTIN 300 MG PO CAPS
ORAL_CAPSULE | ORAL | 2 refills | Status: DC
Start: 2020-03-26 — End: 2020-06-01

## 2020-04-05 ENCOUNTER — Encounter (INDEPENDENT_AMBULATORY_CARE_PROVIDER_SITE_OTHER): Payer: Self-pay

## 2020-04-05 ENCOUNTER — Other Ambulatory Visit: Payer: Self-pay | Admitting: "Endocrinology

## 2020-04-06 ENCOUNTER — Encounter (INDEPENDENT_AMBULATORY_CARE_PROVIDER_SITE_OTHER): Payer: Self-pay | Admitting: Vascular Neurology

## 2020-04-08 ENCOUNTER — Ambulatory Visit: Payer: Medicare Other | Attending: Vascular Neurology

## 2020-04-08 DIAGNOSIS — G479 Sleep disorder, unspecified: Secondary | ICD-10-CM | POA: Insufficient documentation

## 2020-04-08 DIAGNOSIS — R519 Headache, unspecified: Secondary | ICD-10-CM | POA: Insufficient documentation

## 2020-04-08 DIAGNOSIS — IMO0002 Reserved for concepts with insufficient information to code with codable children: Secondary | ICD-10-CM

## 2020-04-08 DIAGNOSIS — G4739 Other sleep apnea: Secondary | ICD-10-CM

## 2020-04-08 DIAGNOSIS — G478 Other sleep disorders: Secondary | ICD-10-CM

## 2020-04-08 DIAGNOSIS — G43709 Chronic migraine without aura, not intractable, without status migrainosus: Secondary | ICD-10-CM | POA: Insufficient documentation

## 2020-04-08 DIAGNOSIS — G4730 Sleep apnea, unspecified: Secondary | ICD-10-CM

## 2020-04-08 DIAGNOSIS — R400 Somnolence: Secondary | ICD-10-CM

## 2020-04-09 ENCOUNTER — Encounter (INDEPENDENT_AMBULATORY_CARE_PROVIDER_SITE_OTHER): Payer: Self-pay | Admitting: Vascular Neurology

## 2020-04-09 NOTE — Progress Notes (Signed)
Patient Katherine Mays is a 69 years old female with a history of HTN, Chronic migraine, Kidney disease, Depression, etc., who is coming in the lab for a NPSG study. Patient was informed about the procedure and normal hook up process was performed. During this study were noted: - difficult falling asleep, - few respiratory events, - frequent awakenings, - some arousals, - few leg movements. Patient slept in her supine and in her side position.  Mostly normal sinusal rhythm.   Lowest O2 at 84 %.

## 2020-04-10 NOTE — Progress Notes (Signed)
PSG study of adequate duration completed. Pt educated on procedure and demonstrated understanding

## 2020-04-13 ENCOUNTER — Encounter (INDEPENDENT_AMBULATORY_CARE_PROVIDER_SITE_OTHER): Payer: Self-pay | Admitting: Internal Medicine

## 2020-04-13 ENCOUNTER — Ambulatory Visit (INDEPENDENT_AMBULATORY_CARE_PROVIDER_SITE_OTHER): Payer: Medicare Other | Admitting: Internal Medicine

## 2020-04-13 ENCOUNTER — Encounter (INDEPENDENT_AMBULATORY_CARE_PROVIDER_SITE_OTHER): Payer: Self-pay

## 2020-04-13 VITALS — BP 128/80 | HR 73 | Temp 98.4°F | Resp 12 | Ht 62.5 in | Wt 116.0 lb

## 2020-04-13 DIAGNOSIS — E039 Hypothyroidism, unspecified: Secondary | ICD-10-CM

## 2020-04-13 DIAGNOSIS — I10 Essential (primary) hypertension: Secondary | ICD-10-CM

## 2020-04-13 DIAGNOSIS — E7800 Pure hypercholesterolemia, unspecified: Secondary | ICD-10-CM

## 2020-04-13 LAB — COMPREHENSIVE METABOLIC PANEL
ALT: 15 U/L (ref 0–55)
AST (SGOT): 23 U/L (ref 5–34)
Albumin/Globulin Ratio: 1.9 (ref 0.9–2.2)
Albumin: 3.8 g/dL (ref 3.5–5.0)
Alkaline Phosphatase: 44 U/L (ref 37–106)
Anion Gap: 9 (ref 5.0–15.0)
BUN: 11 mg/dL (ref 7.0–19.0)
Bilirubin, Total: 0.4 mg/dL (ref 0.2–1.2)
CO2: 31 mEq/L — ABNORMAL HIGH (ref 21–29)
Calcium: 8.9 mg/dL (ref 8.5–10.5)
Chloride: 99 mEq/L — ABNORMAL LOW (ref 100–111)
Creatinine: 0.9 mg/dL (ref 0.4–1.5)
Globulin: 2 g/dL (ref 2.0–3.7)
Glucose: 82 mg/dL (ref 70–100)
Potassium: 3.7 mEq/L (ref 3.5–5.1)
Protein, Total: 5.8 g/dL — ABNORMAL LOW (ref 6.0–8.3)
Sodium: 139 mEq/L (ref 136–145)

## 2020-04-13 LAB — CBC AND DIFFERENTIAL
Absolute NRBC: 0 10*3/uL (ref 0.00–0.00)
Basophils Absolute Automated: 0.05 10*3/uL (ref 0.00–0.08)
Basophils Automated: 0.8 %
Eosinophils Absolute Automated: 0.39 10*3/uL (ref 0.00–0.44)
Eosinophils Automated: 6.5 %
Hematocrit: 40.9 % (ref 34.7–43.7)
Hgb: 12.7 g/dL (ref 11.4–14.8)
Immature Granulocytes Absolute: 0.02 10*3/uL (ref 0.00–0.07)
Immature Granulocytes: 0.3 %
Lymphocytes Absolute Automated: 2.17 10*3/uL (ref 0.42–3.22)
Lymphocytes Automated: 36.3 %
MCH: 31.1 pg (ref 25.1–33.5)
MCHC: 31.1 g/dL — ABNORMAL LOW (ref 31.5–35.8)
MCV: 100 fL — ABNORMAL HIGH (ref 78.0–96.0)
MPV: 9.4 fL (ref 8.9–12.5)
Monocytes Absolute Automated: 0.63 10*3/uL (ref 0.21–0.85)
Monocytes: 10.6 %
Neutrophils Absolute: 2.71 10*3/uL (ref 1.10–6.33)
Neutrophils: 45.5 %
Nucleated RBC: 0 /100 WBC (ref 0.0–0.0)
Platelets: 310 10*3/uL (ref 142–346)
RBC: 4.09 10*6/uL (ref 3.90–5.10)
RDW: 13 % (ref 11–15)
WBC: 5.97 10*3/uL (ref 3.10–9.50)

## 2020-04-13 LAB — LIPID PANEL
Cholesterol / HDL Ratio: 2.8
Cholesterol: 231 mg/dL — ABNORMAL HIGH (ref 0–199)
HDL: 84 mg/dL (ref 40–9999)
LDL Calculated: 133 mg/dL — ABNORMAL HIGH (ref 0–99)
Triglycerides: 71 mg/dL (ref 34–149)
VLDL Calculated: 14 mg/dL (ref 10–40)

## 2020-04-13 LAB — GFR: EGFR: >60

## 2020-04-13 LAB — HEMOLYSIS INDEX: Hemolysis Index: 5 (ref 0–18)

## 2020-04-13 NOTE — Progress Notes (Signed)
[facility] PRIMARY CARE  [place]  PROGRESS NOTE      Patient: Katherine Mays   Date: 04/13/2020   MRN: [id number]     ASSESSMENT/PLAN     Katherine Mays is a 69 y.o. female    Chief Complaint   Patient presents with    Hypertension    Hyperlipidemia    Hypothyroidism        1. Essential hypertension, benign  Blood pressure was high at the presentation but improved after 15 minutes of rest.  Encouraged to continue current medication which is Cardizem CD 180 mg once a day.  The risks and complications of high blood pressure have been explained. Importance of low salt diet, and weight control have been explained.      - CBC and differential  - Comprehensive metabolic panel    2. Pure hypercholesterolemia  Controlled with the diet and exercise.  Continue current therapeutic lifestyle changes.  The risks and complications of dyslipidemia have been explained. Importance of low fat diet, and aerobic exercises have been explained.      - CBC and differential  - Comprehensive metabolic panel  - Lipid panel    3. Acquired hypothyroidism  Patient recently had an evaluation by endocrinologist and had a blood test which were within acceptable range.  She is currently on Synthroid 75 mcg 6 days a week.  She skips on Sundays    - CBC and differential  - Comprehensive metabolic panel           MEDICATIONS     Current Outpatient Medications   Medication Sig Dispense Refill    acetaminophen (TYLENOL) 500 MG tablet Take 1,000 mg by mouth every 6 (six) hours as needed for Pain      CALCIUM-VITAMIN D PO Take by mouth 2 (two) times daily      Cholecalciferol (VITAMIN D3 PO) 2 (two) times daily      Cyanocobalamin (B-12 PO) daily sublingual      Desvenlafaxine ER 100 MG Tablet SR 24 hr Take 100 mg by mouth daily      dilTIAZem (CARDIZEM CD) 180 MG 24 hr capsule Take 1 capsule (180 mg total) by mouth daily (Patient taking differently: Take 180 mg by mouth every morning   ) 90 capsule 3    gabapentin (NEURONTIN) 300 MG capsule 1  BID x 1 week then 2 BID (Patient taking differently: Take 300 mg by mouth 2 (two) times daily   ) 120 capsule 2    levothyroxine (SYNTHROID) 75 MCG tablet Take 75 mcg by mouth Once a day at 6:00am Only 6 times a week- no dose in Sunday.        PROLIA 60 MG/ML Solution subcutaneous injection every 6 (six) months         Riboflavin (B2) 100 MG Tab Take 100 mg by mouth daily         traMADol (ULTRAM) 50 MG tablet Take by mouth every 6 (six) hours as needed 1-2 tablets PRN        valACYclovir HCL (VALTREX) 500 MG tablet Take 500 mg by mouth as needed         diazePAM (VALIUM PO) Take by mouth once Unknown dose, Dr Faylene Million was to call this into pts Pharmacy preop to be taken AM DOS.         No current facility-administered medications for this visit.       Allergies   Allergen Reactions    Latex Itching  Keflex [Cephalexin] Itching and Rash    Nickel Itching and Rash       SUBJECTIVE     Chief Complaint   Patient presents with    Hypertension    Hyperlipidemia    Hypothyroidism        Hypertension  The patient is being seen for a routine follow-up of hypertension. Hypertension is classified as chronic. There is no history of CHF and proteinuria. Comorbid illnesses include hyperlipidemia. There are no recent interval events.  There is no interval history of chest pain, claudication, orthopnea and palpitations.  Current therapy includes calcium channel blockers and lifestyle changes. Patient is compliant with the current regimen.   Average ambulatory systolic blood pressure is generally in the 120's. Average ambulatory diastolic blood pressure is generally in the 70's.    Hyperlipidemia  The patient is being seen for a routine follow-up of hyperlipidemia. Hyperlipidemia is classified as pure hypercholesterolemia. Comorbid illnesses include hypertension and hypothyroidism.  There are no recent interval events. Interval symptoms include lower extremity edema. There is no interval history of myalgias, near-syncope  and orthopnea. Current therapy includes lifestyle changes.  Patient is compliant with the current regimen.     Hypothyroidism  The patient is being seen for a routine follow-up of hypothyroidism. Hypothyroidism is classified as acquired. There are no known risk factors.  Comorbid illnesses include hyperlipidemia.  Interval events include an endocrine consult.  There is no interval history of anxiety and fatigue. Current therapy includes levothyroxine.  Patient is compliant with the current regimen.     Lab Results   Component Value Date    CHOL 226 (H) 10/15/2019    CHOL 236 (H) 02/26/2019    CHOL 239 (H) 07/29/2018     Lab Results   Component Value Date    HDL 92 10/15/2019    HDL 85 02/26/2019    HDL 87 07/29/2018     Lab Results   Component Value Date    LDL 123 (H) 10/15/2019    LDL 128 (H) 02/26/2019    LDL 129 (H) 07/29/2018     Lab Results   Component Value Date    TRIG 54 10/15/2019    TRIG 114 02/26/2019    TRIG 113 07/29/2018             ROS     Review of Systems   Constitutional: Negative for fatigue, fever and unexpected weight change.   Respiratory: Negative for cough and shortness of breath.    Cardiovascular: Positive for leg swelling. Negative for chest pain.   Gastrointestinal: Negative.    Musculoskeletal: Positive for arthralgias.   Neurological: Negative.    Psychiatric/Behavioral: Negative.    All other systems reviewed and are negative.      Patient Active Problem List   Diagnosis    Senile osteoporosis    Chronic kidney disease    Depression    Essential hypertension, benign    Hypothyroidism    Chronic daily headache    Chronic migraine    Cervicalgia    Cervical spine disease    Sleep difficulties    Unrefreshed by sleep    Daytime sleepiness    History of depression    Chronic right-sided low back pain without sciatica    Medication management       Past Medical History:   Diagnosis Date    Anemia     Hx, stable at present (01/07/2020).    Anxiety     Aortic valve  insufficiency     per Echo 12/28/2019    Atrial septal aneurysm     Per Echo 1/49/2021    Broken foot     right foot and pinky fingers.    Cervical spine pain     Chronic kidney disease, stage I 06/2013    Claustrophobia     Constipation     Depression age 21     followed by Psychiatrist.     Edema     left leg if sitting too long.    Encounter for blood transfusion 1986    with pregnancy high risk with bleeding. No issues known.    Gastroesophageal reflux disease     History of positive PPD     CXRAYs all WNL     Hyperlipidemia     Hypertension     Labile HTN ~150/85-90 to 120/80 per pt. Pt states PCP monitored. White Coat Syndrome     Hypothyroidism     stable on med per pt.    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      PHYSICAL EXAM     Vitals:    04/13/20 0717 04/13/20 0718 04/13/20 0732 04/13/20 0733   BP: 146/75 156/78 138/78 128/80   BP Site: Left arm Right arm Right arm Left arm   Patient Position: Sitting Sitting Sitting Sitting   Cuff Size: Medium Medium Medium Medium   Pulse: 73      Resp: 12      Temp: 98.4 F (36.9 C)      TempSrc: Temporal      SpO2: 98%      Weight: 52.6 kg (116 lb)      Height: 1.588 m (5' 2.5")            Physical Exam   Constitutional: She is oriented to person, place, and time. She appears well-developed and well-nourished. No distress.   Cardiovascular: Normal rate, regular rhythm and normal heart sounds.   Pulmonary/Chest: Effort normal and breath sounds normal. No respiratory distress. She has no wheezes.   Musculoskeletal:          General: Edema ( Trace edema left ankle) present. No tenderness or deformity. Normal range of motion.   Neurological: She is alert and oriented to person, place, and time.   Skin: Skin is warm and dry. She is not diaphoretic.   Psychiatric: She has a normal mood and affect. Her behavior is normal. Thought content normal.   Vitals reviewed.            Signed,  Clydell Hakim, MD  04/13/2020

## 2020-04-13 NOTE — Progress Notes (Signed)
Have you seen any specialists/other providers since your last visit with us?    No    Arm preference verified?   Yes    The patient is due for colonoscopy, shingles vaccine and Advance directive on file, COVID-19 Vaccine

## 2020-04-18 ENCOUNTER — Ambulatory Visit (INDEPENDENT_AMBULATORY_CARE_PROVIDER_SITE_OTHER): Payer: Medicare Other

## 2020-04-18 DIAGNOSIS — Z23 Encounter for immunization: Secondary | ICD-10-CM

## 2020-04-20 ENCOUNTER — Telehealth (INDEPENDENT_AMBULATORY_CARE_PROVIDER_SITE_OTHER): Payer: Self-pay | Admitting: Internal Medicine

## 2020-04-20 DIAGNOSIS — I10 Essential (primary) hypertension: Secondary | ICD-10-CM

## 2020-04-20 MED ORDER — DILTIAZEM HCL ER COATED BEADS 180 MG PO CP24
180.00 mg | ORAL_CAPSULE | Freq: Every day | ORAL | 5 refills | Status: DC
Start: 2020-04-20 — End: 2020-10-12

## 2020-04-20 NOTE — Telephone Encounter (Signed)
Prescription sent

## 2020-04-20 NOTE — Telephone Encounter (Signed)
Last OV 04/13/2020 Rx is not on current med list.  Pt is requesting from PCP a  Rx refill for diltiazem er 180 mg .  Pt asked if PCP can give her just 7 -10 capsules since she will be traveling and needs the medication ASAP .

## 2020-04-20 NOTE — Telephone Encounter (Signed)
Pt is requesting from PCP a  Rx refill for diltiazem er 180 mg .  Pt asked if PCP can give her just 7 -10 capsules since she will be traveling and needs the medication ASAP .        Preferred Pharmacies    [id number] - [place], [place]  - [address] OF [facility] Phone:  [phone]   Fax:  [phone]          Pt info  [phone] (M)      Thank you

## 2020-04-24 ENCOUNTER — Ambulatory Visit (INDEPENDENT_AMBULATORY_CARE_PROVIDER_SITE_OTHER): Payer: Medicare Other

## 2020-05-01 NOTE — Procedures (Signed)
Service Date: 04/08/2020     Patient Type: O     PHYSICIAN/PROVIDER: Campbell Lerner MD     REFERRING PHYSICIAN:      INDICATION:  Chronic migraines, unrefreshing sleep, chronic daily headache, daytime  sleepiness.     SUMMARY OF FINDINGS:  The total sleep time was 329.0 minutes with a sleep efficiency of 83.8%.   The latency sleep onset was 38.2 minutes, with a REM latency of 287.5  minutes, both prolonged.  The patient achieved all stages of sleep during  the study with slightly reduced amount of REM sleep than expected.     The total arousal index was 13.7, with an awakening index of 3.3.  Arousals  were primarily seen with disturbed-breathing events or spontaneously.  The  periodic limb movement index was 21.0, though infrequently seen with  arousals.     The apnea-hypopnea index was 5.3 with a respiratory disturbance index of  9.8.  The apnea-hypopnea index in the supine position was 16.2.  Minimum  oxygen saturation during sleep was  84% with an average oxygen saturation  during sleep of 93.8%.  Average pulse rate was 75.8.     Sleep architecture is noted to be somewhat fragmented.  Heart rate in the  80s, primarily sinus rhythm.     IMPRESSION:  1.  Mild sleep apnea, noted to be moderate in the supine position.  Given  the symptomatology and findings, CPAP is recommended for therapy, either  through a trial of AutoPAP or in-lab continuous positive airway pressure  titration study.  Other conservative measures include avoiding sleeping  supine, raising the head of the bed or using a wedge pillow, weight loss if  appropriate.  If not done so previously, consider checking the patient's  thyroid function.  2.  Mild periodic limb movements of sleep.  These were infrequently  associated with arousals, thus the clinical significance is unclear.  If  clinically indicated, consider iron/ferritin testing and pharmacological  therapy. Symptoms may also improve with treatment of the underlying sleep  apnea.  Correlation  recommended.  3.  First night effect, not uncommon in laboratory setting.     Thank you for referring this patient for evaluation.           D:  04/30/2020 21:42 PM by Dr. Minerva Areola B. Stacy Gardner, MD ([id number])  T:  05/01/2020 06:08 AM by NTS      Everlean Cherry: [id number]) (Doc ID: [id number])

## 2020-05-09 ENCOUNTER — Encounter (INDEPENDENT_AMBULATORY_CARE_PROVIDER_SITE_OTHER): Payer: Self-pay

## 2020-06-01 ENCOUNTER — Ambulatory Visit (INDEPENDENT_AMBULATORY_CARE_PROVIDER_SITE_OTHER): Payer: Medicare Other | Admitting: Vascular Neurology

## 2020-06-01 ENCOUNTER — Other Ambulatory Visit (FREE_STANDING_LABORATORY_FACILITY): Payer: Medicare Other

## 2020-06-01 ENCOUNTER — Telehealth: Payer: Self-pay

## 2020-06-01 ENCOUNTER — Encounter (INDEPENDENT_AMBULATORY_CARE_PROVIDER_SITE_OTHER): Payer: Self-pay | Admitting: Vascular Neurology

## 2020-06-01 VITALS — BP 154/92 | HR 73 | Ht 62.5 in | Wt 113.0 lb

## 2020-06-01 DIAGNOSIS — G43709 Chronic migraine without aura, not intractable, without status migrainosus: Secondary | ICD-10-CM

## 2020-06-01 DIAGNOSIS — IMO0002 Reserved for concepts with insufficient information to code with codable children: Secondary | ICD-10-CM

## 2020-06-01 DIAGNOSIS — G4761 Periodic limb movement disorder: Secondary | ICD-10-CM

## 2020-06-01 DIAGNOSIS — M542 Cervicalgia: Secondary | ICD-10-CM

## 2020-06-01 DIAGNOSIS — Z79899 Other long term (current) drug therapy: Secondary | ICD-10-CM | POA: Insufficient documentation

## 2020-06-01 DIAGNOSIS — Z8739 Personal history of other diseases of the musculoskeletal system and connective tissue: Secondary | ICD-10-CM

## 2020-06-01 DIAGNOSIS — G478 Other sleep disorders: Secondary | ICD-10-CM

## 2020-06-01 DIAGNOSIS — G4730 Sleep apnea, unspecified: Secondary | ICD-10-CM

## 2020-06-01 DIAGNOSIS — M25551 Pain in right hip: Secondary | ICD-10-CM | POA: Insufficient documentation

## 2020-06-01 DIAGNOSIS — M489 Spondylopathy, unspecified: Secondary | ICD-10-CM

## 2020-06-01 LAB — IRON PROFILE
Iron Saturation: 35 % (ref 15–50)
Iron: 113 ug/dL (ref 40–145)
TIBC: 323 ug/dL (ref 265–497)
UIBC: 210 ug/dL (ref 126–382)

## 2020-06-01 LAB — FERRITIN: Ferritin: 125.39 ng/mL (ref 4.60–204.00)

## 2020-06-01 LAB — HEMOLYSIS INDEX: Hemolysis Index: 2 (ref 0–18)

## 2020-06-01 MED ORDER — NURTEC 75 MG PO TBDP
75.00 mg | ORAL_TABLET | Freq: Every day | ORAL | 3 refills | Status: AC | PRN
Start: 2020-06-01 — End: ?

## 2020-06-01 MED ORDER — GABAPENTIN 300 MG PO CAPS
ORAL_CAPSULE | ORAL | 1 refills | Status: DC
Start: 2020-06-01 — End: 2020-12-21

## 2020-06-01 NOTE — Telephone Encounter (Signed)
Received Request via In Basket      Nurtec 75 mg Tablet Dispersible    Initiating BI

## 2020-06-01 NOTE — Telephone Encounter (Signed)
Submitted PA via [facility]  [facility]    Waiting on Insurer to decide

## 2020-06-01 NOTE — Patient Instructions (Signed)
Our plan:     Consider raising the head of the bed or using a wedge pillow.  Avoid sleeping on your back.  Let us know if you'd like to try CPAP.  Trial of Nurtec for migraine.  Continue gabapentin 600mg  AM & PM - can take an additional 300mg  at night if having trouble sleeping.  Check iron panel.  Referral for orthopedic evaluation.    Please call the office at either [phone] or [phone] to schedule a follow up appointment as discussed.    Today's Visit:      In today's visit, I reviewed your medications and records relating your health - prior testing, blood work, reports of other health care providers present in your electronic medical record.     If you have pertinent records from any non-[place] doctors that you would like to review, please have them sent to [place] or bring to them to your next office visit.     A copy of today's visit will be sent to your referring doctor and/or primary care doctor, if you have one listed in our system.    Let me know if there are things we could have done better for your office visit.    Patient satisfaction survey:      If you receive a patient satisfaction survey, I would greatly appreciate it if you would complete it. We value your feedback.     Contact me online:      Patient Portal online - Please sign up for MyChart -- this is the best way to communicate with your team here.  There is a messaging feature, where you can [place] messages anytime of day.  It is the best way to communicate with [place] and get test results, medication refills, or ask questions.     You can expect to get a response within 24-72 hours during weekdays.  If you do not receive a timely response, resend your request and inform us. My goal is for every question answered ever day.  Average response for a phone call maybe 3 days due to the volume we receive (which is why MyChart is preferred).    If you are having a medical emergency -- call 911, DO NOT SEND A MESSAGE THROUGH MYCHART.     Coupons for  medication:      If you have any trouble affording your medications, check out www.goodrx.com for coupons and competitive prices in your area.  If you need further assistance, let us know so we can work with you and your insurance to make sure you get the best care.    Thank you for trusting me with your health.      Take care,     Artemio Aly, MD  [facility]

## 2020-06-01 NOTE — Progress Notes (Signed)
Subjective:      Patient ID: Katherine Mays  CC:   Chief Complaint   Patient presents with    Headache       HPI  The patient says the gabapentin helps with her pain as well as sleep although there are still nights where she cannot get to sleep.    Her headaches are not as frequent, but when she has them she will usually take Tylenol and then tramadol, but it takes a while for her to have that kick in.  She does get nausea and photophobia with her headaches.    If she does not sleep, then she gets very tired and sleepy the next day.    She knows had earlier in this year she was stretching and felt like she overextended her hip and feels like the hip is "just out".  She notes this symptom while walking, with only the right hip involved.    She also has noted if she gets in and out of bed or goes from a sitting to standing position she gets more pain in her low back but it does not radiate down the legs.  She has had some the symptoms since the mid 1980s.  Physical therapy in the past has not been helpful.    Polysomnogram, 04/08/2020  IMPRESSION:  1.  Mild sleep apnea, noted to be moderate in the supine position.  Given  the symptomatology and findings, CPAP is recommended for therapy, either  through a trial of AutoPAP or in-lab continuous positive airway pressure  titration study.  Other conservative measures include avoiding sleeping  supine, raising the head of the bed or using a wedge pillow, weight loss if  appropriate.  If not done so previously, consider checking the patient's  thyroid function.  2.  Mild periodic limb movements of sleep.  These were infrequently  associated with arousals, thus the clinical significance is unclear.  If  clinically indicated, consider iron/ferritin testing and pharmacological  therapy. Symptoms may also improve with treatment of the underlying sleep  apnea.  Correlation recommended.  3.  First night effect, not uncommon in laboratory setting.    MRI C-spine 2018   IMPRESSION:     1. 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 There is  mild to moderate foraminal stenosis.    2. Disc disease C6-C7 with minimal narrowing of central canal. There is  mild to moderate foraminal stenosis.    3. Milder degenerative change at other levels.    CMP 04/2020  Normal BUN/Cr.    Objective:     Vitals: Reviewed.   Visit Vitals  BP (!) 154/92 (BP Site: Left arm, Patient Position: Sitting, Cuff Size: Medium)   Pulse 73   Ht 1.588 m (5' 2.5")   Wt 51.3 kg (113 lb)   BMI 20.34 kg/m     Respiratory rate appears normal.    General: WDWN, NAD. Vital signs reviewed.  HEENT: No nasal discharges, no obvious oral masses. No icterus or conjunctival hemorrhage noted. Mallampati IV.  Neck: Full ROM. No neck masses noted.  Psychiatric: Cooperative with exam, normal affect. Thought process and speech pattern normal.    NEUROLOGICAL EXAM    Mental Status: Awake, alert, oriented. Speech fluent without dysarthria. Follows commands well. Attention, memory, and concentration intact. Fund of knowledge appropriate for education.     Cranial Nerves: Extraocular movements are full. No nystagmus seen. Facial sensation to light touch appears intact. Face is symmetric.  Hearing is intact to conversational speech. Tongue midline. Cranial nerves II-XII otherwise appears intact.     Motor: Moves extremities well, without UE drift. No obvious focal weakness noted. Bulk appears normal. No tremor noted.    Sensation: Light touch appears intact.     Coordination: No obvious dysmetria.     Gait: deferred      Assessment:       1. Mild sleep apnea    2. Unrefreshed by sleep    3. Periodic limb movements of sleep    4. Chronic migraine    5. Cervicalgia    6. Cervical spine disease    7. History of low back pain - x 25+ years    8. Right hip pain    9. Medication management        Plan:      1. Consider raising the head of the bed or using wedge pillow  2. We discussed potentially using CPAP for the mild  sleep apnea, the patient declined at this time. We also discussed other options such as oral appliance therapy, bongo but will hold off at this time. She will let me know if she would like to try 1 of those options.  3. Check iron panel with regards to periodic limb movements of sleep.  4. Continue gabapentin 600 mg twice a day but could take an additional 300 mg at bedtime if having trouble sleeping.  5. Trial of Nurtec ODT for acute migraine treatment, could use with Tylenol.  6. Referral to orthopedics for complaints of back and hip pain.  7. Follow-up in a few months for reassessment.    40 minutes spent involved in this patient's care today.  The patient will return for follow-up as outlined above. If there are any problems with medications (if prescribed) or questions about any available test results, or if there are any new symptoms or changes in current symptoms, the patient is instructed to call the office.         Kebrina Friend B. Stacy Gardner, MD  Director, [facility]  Co-Director, [facility] - [facility], Neurology  Board Certified, Sleep Medicine  Board Certified, Clinical Neurophysiology  Board Certified, Vascular Neurology        *This note was generated by the Epic EMR system/ Dragon speech recognition and may contain inherent errors or omissions not intended by the user. Grammatical errors, random word insertions, deletions, pronoun errors and incomplete sentences are occasional consequences of this technology due to software limitations. Not all errors are caught or corrected. If there are questions or concerns about the content of this note or information contained within the body of this dictation they should be addressed directly with the author for clarification.*

## 2020-06-02 NOTE — Telephone Encounter (Signed)
Received denial via fax. Patient must try and fail all of the following: sumatriptan, naratriptan, rizatriptan, zolmitriptan

## 2020-06-04 NOTE — Progress Notes (Signed)
Results reviewed - will discuss with patient at next follow up appointment.

## 2020-06-06 ENCOUNTER — Telehealth (INDEPENDENT_AMBULATORY_CARE_PROVIDER_SITE_OTHER): Payer: Self-pay

## 2020-06-06 NOTE — Telephone Encounter (Signed)
Pt called and LM on VM about gabapentin. Called pt back and LM on VM to call back for clarification.

## 2020-06-07 ENCOUNTER — Encounter (INDEPENDENT_AMBULATORY_CARE_PROVIDER_SITE_OTHER): Payer: Self-pay

## 2020-06-07 ENCOUNTER — Telehealth (INDEPENDENT_AMBULATORY_CARE_PROVIDER_SITE_OTHER): Payer: Self-pay

## 2020-06-07 NOTE — Telephone Encounter (Signed)
Rep from [facility] called asking which medication pt has tried and failed. List of medications were given and rep had no further questions or concerns.

## 2020-06-08 NOTE — Telephone Encounter (Signed)
Pt called stating issue has been resolved. Pt had no further questions or concerns.

## 2020-06-08 NOTE — Telephone Encounter (Signed)
Requesting prior authorization for Nurtec 75 mg ODT     PA Outcome APPROVED    Quantity of 8 for a day supply of 30     Insurance [employer]    Reference #? [id number]    Dates in effect, from 12/10/2019 through 06/07/2021    Pharmacy and phone number [facility]

## 2020-06-15 ENCOUNTER — Encounter: Payer: Self-pay | Admitting: Gastroenterology

## 2020-06-15 ENCOUNTER — Ambulatory Visit: Payer: Medicare Other | Attending: Gastroenterology

## 2020-06-15 NOTE — Pre-Procedure Instructions (Signed)
·   Surgical Risk Level : (Low, Intermediate, High)  ? Low [x]   ? Intermediate []   ? High []       Surgeon Testing Requirements:  none    Anesthesia Guideline Requirements:   None required per Anesthesia guidelines.     Specialist Notes / Test Results / Records Requested:  ? N/A      Recent Hospitalization / ED Visit:   ? N/A      Future Plan / Upcoming Appts:   ? na  ?      Labs/Testing @ [facility]: N/A        Email Sent To Patient:   ? Pt verbalized understanding of arrival location   Faxes Sent To:    *Scientist, forensic for H&P   *Pharmacy-Preop orders, Yes[]  None [x]       Epic Orders Entered:    Anesthesia Preop orders entered.    SCD ,  NA []         Other important info  ? Pt said she was also to have EGD.  Fax sent to surgeon asking to verify with patient      Chart Room Handoff:     Surgeon HP     NPO instruction -see Below     Bowel preop and Last clear drink instruction DOS  from GE DR instruction.Pt readback understaning of this instruction. [x]

## 2020-06-21 NOTE — Pre-Procedure Instructions (Signed)
Fax sent to surgeon for H/P

## 2020-06-22 NOTE — Pre-Procedure Instructions (Signed)
Call to surgeon office, confirmed case should be both Colonoscopy and EGD.  New posting sheet will be sent.  HP to be done DOS

## 2020-06-23 NOTE — Pre-Procedure Instructions (Signed)
Pt left VM [phone] asking what time is her arrival time if her procedure time is 1300.    Pt called back/ VMML: pt instructed her arrival time is 1200 for procedure ar 1300.

## 2020-06-23 NOTE — Pre-Procedure Instructions (Signed)
Faxed Dr. Huh's office [phone] for H&P.

## 2020-06-26 ENCOUNTER — Ambulatory Visit
Admission: RE | Admit: 2020-06-26 | Discharge: 2020-06-26 | Disposition: A | Discharge status: Home / Self Care | Payer: Medicare Other | Source: Ambulatory Visit | Attending: Gastroenterology | Admitting: Gastroenterology

## 2020-06-26 ENCOUNTER — Ambulatory Visit: Payer: Medicare Other | Admitting: Pain Medicine

## 2020-06-26 ENCOUNTER — Encounter: Payer: Self-pay | Admitting: Gastroenterology

## 2020-06-26 ENCOUNTER — Encounter
Admission: RE | Disposition: A | Discharge status: Home / Self Care | Payer: Self-pay | Source: Ambulatory Visit | Attending: Gastroenterology

## 2020-06-26 DIAGNOSIS — G4730 Sleep apnea, unspecified: Secondary | ICD-10-CM | POA: Insufficient documentation

## 2020-06-26 DIAGNOSIS — I129 Hypertensive chronic kidney disease with stage 1 through stage 4 chronic kidney disease, or unspecified chronic kidney disease: Secondary | ICD-10-CM | POA: Insufficient documentation

## 2020-06-26 DIAGNOSIS — E039 Hypothyroidism, unspecified: Secondary | ICD-10-CM | POA: Insufficient documentation

## 2020-06-26 DIAGNOSIS — I071 Rheumatic tricuspid insufficiency: Secondary | ICD-10-CM | POA: Insufficient documentation

## 2020-06-26 DIAGNOSIS — E785 Hyperlipidemia, unspecified: Secondary | ICD-10-CM | POA: Insufficient documentation

## 2020-06-26 DIAGNOSIS — N181 Chronic kidney disease, stage 1: Secondary | ICD-10-CM | POA: Insufficient documentation

## 2020-06-26 DIAGNOSIS — K449 Diaphragmatic hernia without obstruction or gangrene: Secondary | ICD-10-CM | POA: Insufficient documentation

## 2020-06-26 DIAGNOSIS — K3189 Other diseases of stomach and duodenum: Secondary | ICD-10-CM | POA: Insufficient documentation

## 2020-06-26 DIAGNOSIS — K2100 Gastro-esophageal reflux disease with esophagitis, without bleeding: Secondary | ICD-10-CM | POA: Insufficient documentation

## 2020-06-26 DIAGNOSIS — R002 Palpitations: Secondary | ICD-10-CM | POA: Insufficient documentation

## 2020-06-26 DIAGNOSIS — K635 Polyp of colon: Secondary | ICD-10-CM | POA: Insufficient documentation

## 2020-06-26 DIAGNOSIS — K30 Functional dyspepsia: Secondary | ICD-10-CM | POA: Insufficient documentation

## 2020-06-26 DIAGNOSIS — Z1211 Encounter for screening for malignant neoplasm of colon: Secondary | ICD-10-CM

## 2020-06-26 DIAGNOSIS — D631 Anemia in chronic kidney disease: Secondary | ICD-10-CM | POA: Insufficient documentation

## 2020-06-26 HISTORY — PX: EGD, COLONOSCOPY: SHX3799

## 2020-06-26 SURGERY — EGD, COLONOSCOPY
Anesthesia: Anesthesia General | Site: Abdomen

## 2020-06-26 MED ORDER — LIDOCAINE HCL 2 % IJ SOLN
INTRAMUSCULAR | Status: DC | PRN
Start: 2020-06-26 — End: 2020-06-26
  Administered 2020-06-26: 60 mg

## 2020-06-26 MED ORDER — PROPOFOL INFUSION 10 MG/ML
INTRAVENOUS | Status: DC | PRN
Start: 2020-06-26 — End: 2020-06-26
  Administered 2020-06-26: 20 mg via INTRAVENOUS
  Administered 2020-06-26: 70 mg via INTRAVENOUS
  Administered 2020-06-26 (×2): 30 mg via INTRAVENOUS
  Administered 2020-06-26: 40 mg via INTRAVENOUS
  Administered 2020-06-26 (×3): 30 mg via INTRAVENOUS

## 2020-06-26 MED ORDER — LACTATED RINGERS IV SOLN
INTRAVENOUS | Status: DC
Start: 2020-06-26 — End: 2020-06-26

## 2020-06-26 MED ORDER — PROPOFOL 10 MG/ML IV EMUL (WRAP)
INTRAVENOUS | Status: AC
Start: 2020-06-26 — End: ?
  Filled 2020-06-26: qty 20

## 2020-06-26 SURGICAL SUPPLY — 38 items
BLOCK BITE MAXI 60FR LF STRD STRAP SDPRT (Procedure Accessories) ×1
BLOCK BITE OD60 FR STURDY STRAP SIDEPORT (Procedure Accessories) ×1 IMPLANT
BLOCK BITE OD60 FR STURDY STRAP SIDEPORT DENTAL RETENTION RIM MAXI (Procedure Accessories) ×1 IMPLANT
ELECTRODE ADULT PATIENT RETURN L9 FT REM POLYHESIVE ACRYLIC FOAM (Procedure Accessories) IMPLANT
ELECTRODE PATIENT RETURN L9 FT VALLEYLAB (Procedure Accessories)
ELECTRODE PT RTN RM PHSV ACRL FM C30- LB (Procedure Accessories)
FORCEPS BIOPSY L240 CM JUMBO MICROMESH (Instrument)
FORCEPS BIOPSY L240 CM JUMBO MICROMESH TEETH STREAMLINE CATHETER (Instrument) IMPLANT
FORCEPS BIOPSY L240 CM LARGE CAPACITY (Instrument) ×1 IMPLANT
FORCEPS BIOPSY L240 CM MICROMESH TEETH STREAMLINE CATHETER NEEDLE (Instrument) IMPLANT
FORCEPS BX SS JMB RJ 4 2.8MM 240CM STRL (Instrument)
FORCEPS BX SS LG CPC RJ 4 2.4MM 240CM (Instrument) ×1
GLOVE EXAM LARGE NITRILE CHEMOTHERAPY POWDER FREE SENSE OATMEAL (Glove) ×1 IMPLANT
GLOVE EXAM LARGE NITRILE POWDER FREE SENSE OATMEAL (Glove) ×1 IMPLANT
GLOVE EXAM NITRILE RESTORE LG (Glove) ×1 IMPLANT
GLV EXAM NITRILE RESTORE LG (Glove) ×2
GOWN ISL PP PE REG LG LF FULL BCK NK TIE (Gown) ×4 IMPLANT
GOWN ISOLATION REGULAR LARGE FULL BACK NECK TIE ELASTIC CUFF (Gown) ×2 IMPLANT
MASK FACE FM FLDSHLD LF LVL 3 TIE EYSHLD (Personal Protection) ×4 IMPLANT
NEEDLE SCLEROTHERAPY CARR-LOCKE OD25 GA ODSEC2.5 MM L230 CM INJECTION (Needles) IMPLANT
NEEDLE SCLEROTHERAPY OD25 GA ODSEC2.5 MM (Needles) IMPLANT
NEEDLE SCLRTX SS TFLN CRLK 25GA 2.5MM (Needles)
SNARE ESCP MIC CPTVTR 13MM 240IN STRL (GE Lab Supplies) IMPLANT
SNARE SMALL HEXAGON CAPTIVATOR STIFF ENDOSCOPIC POLYPECTOMY (GE Lab Supplies) IMPLANT
SPONGE GAUZE L4 IN X W4 IN 16 PLY (Dressing) ×1
SPONGE GAUZE L4 IN X W4 IN 16 PLY MAXIMUM ABSORBENT USP TYPE VII (Dressing) ×1 IMPLANT
SPONGE GZE CTTN CRTY 4X4IN LF NS 16 PLY (Dressing) ×1
SYRINGE 50 ML GRADUATE NONPYROGENIC DEHP (Syringes, Needles) ×1 IMPLANT
SYRINGE 50 ML GRADUATE NONPYROGENIC DEHP FREE PVC FREE BD MEDICAL (Syringes, Needles) ×1 IMPLANT
SYRINGE MED 50ML LF STRL GRAD N-PYRG (Syringes, Needles) ×1
TRAP MCS PLS LF STRL SCR CAP TUBE ID LBL (Procedure Accessories)
TRAP MUCUS SCREW CAP TUBE ID LABEL (Procedure Accessories) IMPLANT
TRAP MUCUS SCREW CAP TUBE ID LABEL MEDLINE PLASTIC CLEAR (Procedure Accessories) IMPLANT
TRAP SPEC REM ETRAP 15CM LF STRL MAGNIFY (Procedure Accessories)
TRAP SPECIMEN REMOVAL L15 CM MAGNIFY (Procedure Accessories)
TRAP SPECIMEN REMOVAL L15 CM MAGNIFY WINDOW MEASUREMENT GUIDE ETRAP (Procedure Accessories) IMPLANT
WATER STERILE PLASTIC POUR BOTTLE 250 ML (Irrigation Solutions) ×1 IMPLANT
WATER STRL 250ML LF PLS PR BTL (Irrigation Solutions) ×1

## 2020-06-26 NOTE — H&P (Signed)
GI PRE PROCEDURE NOTE    Proceduralist Comments:   Review of Systems and Past Medical / Surgical History performed: Yes     Indications:GERD  and Colon cancer screening    Previous Adverse Reaction to Anesthesia or Sedation (if yes, describe): No    Physical Exam / Laboratory Data (If applicable)   General: Alert and cooperative  Lungs: Lungs clear to auscultation  Cardiac: RRR, normal S1S2.    Abdomen: Soft, non tender. Normal active bowel sounds  Other:     No labs drawn    American Society of Anesthesiologists (ASA) Physical Status Classification:   Anesthesia ASA Score: 3          Planned Sedation:   Deep sedation with anesthesia    Attestation:   Katherine Mays has been reassessed immediately prior to the procedure and is an appropriate candidate for the planned sedation and procedure. Risks, benefits and alternatives to the planned procedure and sedation have been explained to the patient or guardian:  yes        Signed by: Dayton Martes

## 2020-06-26 NOTE — Transfer of Care (Signed)
Anesthesia Transfer of Care Note    Patient: Katherine Mays [facility]    Procedures performed: Procedure(s) with comments:  EGD, COLONOSCOPY - Colonoscopy      Verbal report/handoff to pacu RN, patient comfortable, no complaints/VS stable.Marland KitchenNAD. Airway maintained.Signed by: Marina Goodell  06/26/20 3:17 PM

## 2020-06-26 NOTE — Anesthesia Preprocedure Evaluation (Signed)
Anesthesia Evaluation    AIRWAY    Mallampati: II    TM distance: >3 FB  Neck ROM: full  Mouth Opening:full  Planned to use difficult airway equipment: No CARDIOVASCULAR    regular and normal       DENTAL    no notable dental hx     PULMONARY    clear to auscultation     OTHER FINDINGS                  Relevant Problems   ANESTHESIA   (+) Mild sleep apnea      PULMONARY   (+) Mild sleep apnea      NEURO/PSYCH   (+) History of depression   (+) History of low back pain - x 25+ years      CARDIO   (+) Chronic migraine   (+) Essential hypertension, benign      GU/RENAL   (+) Chronic kidney disease      ENDO   (+) Hypothyroidism       PSS Anesthesia Comments: Chr Migraine, Hypothyroid, OSA, Aortic valve insuff, Atrial septal aneurysm, Depression, claustrophobia, BMI 20.34        Anesthesia Plan    ASA 3     general                     intravenous induction   Detailed anesthesia plan: general IV  Monitors/Adjuncts: other    Post Op: other  Trial extubation is not planned.  Post op pain management: per surgeon    informed consent obtained    Plan discussed with CRNA.                   Signed by: Juanell Fairly 06/26/20 12:29 PM

## 2020-06-26 NOTE — Anesthesia Postprocedure Evaluation (Signed)
Anesthesia Post Evaluation    Patient: Katherine Mays [facility]    Procedure(s) with comments:  EGD, COLONOSCOPY - Colonoscopy  q1-unk    Anesthesia type: general    Last Vitals: Stabile      Taken Time     06/26/20 1518     06/26/20 1518     06/26/20 1518     06/26/20 1518     06/26/20 1518                 Anesthesia Post Evaluation:     Patient Evaluated: PACU  Patient Participation: complete - patient participated  Level of Consciousness: awake  Pain Score: 0  Pain Management: adequate    Airway Patency: patent    Anesthetic complications: No      PONV Status: none    Cardiovascular status: stable  Respiratory status: room air  Hydration status: stable        Signed by: Juanell Fairly, 06/26/2020 3:18 PM

## 2020-06-26 NOTE — Discharge Instr - AVS First Page (Addendum)
Instructions for after your discharge:  Endoscopy Discharge Instructions  General Instructions:  1. Following sedation, your judgement, perception, and coordination are considered impaired. Even though you may feel awake and alert, you are considered legally intoxicated. Therefore, until the next morning;   Do not Drive   Do not operate appliances or equipment that requires reaction time (e.g. stove, electrical tools, machinery)   Do not sign legal documents or be involved in important decisions.   Do not smoke if alone   Do not drink alcoholic beverages   Go directly home and rest for several hours before resuming your routine activities.   It is highly recommended to have a responsible adult stay with you for the next 24 hours    2. Tenderness, swelling or pain may occur at the IV site where you received sedation. If you experience this, apply warm soaks to the area. Notify your physician if this persists.    Instructions Specific To Procedures - Report To Physician Any Of The Following:    Upper Endoscopy, ERCP, Dilations   1. Pain in Chest   2. Nausea/vomitting   3. Fevers/Chills within 24 hours after procedure. Temp>101deg F   4. Severe and persistent abdominal pain and bloating     In Addition:   Mild throat soreness may follow this procedure. Warm salt water gargling or    lozenges of your choice will most likely relieve your discomfort or cold drinks and   popsicles.     Colon/Sigmoidoscopy/Proctoscopy   1. Severe and persistent abdominal pain/bloating which does not subside within   2-3 hours   2. Large amount of rectal bleeding (some mucosal blood streaking may occur,   especially if biopsy or polypectomy was done or if hemorrhoids are present.   3. Nausea/vomitting   4. Fevers/Chills within 24 hours after procedure. Temp>101deg F     In Addition:   If polyp has been removed, DO NOT take aspirin or aspirin containing products   (e.g. Anacin, Alka Seltzer, Bufferin, Etc.) or non-steroidal  anti-inflammatory drugs   (e.g. Advil, Motrin, etc.) for (per MD) days unless otherwise advised by doctor. Tylenol   or extra Strength Tylenol is permitted.    Additional Discharge Instructions  Your diet after the procedure: First meal should be small, lite, and bland; nothing spicy, fried, or greasy.  Special Instructions: Follow MD instructions on procedural reports.  No foods or drinks containing red dye/color for next 24 hours.  [facility], and pick up electronically sent prescription for a total of 2 prescriptions.    If you have questions or problems contact your MD immediately. If you need immediate attention, call your MD, 911 and/or go to nearest emergency room.

## 2020-06-26 NOTE — Pre-Procedure Instructions (Signed)
Pt left VM [phone] asking if her ride can waitin or has to drop off and pick up. Also how long is the procedure time.    Pt called back/ VMML left for pt: procedure time is 45 minutes for EGD and colonoscopy with 60 minutes in Recovery. Pts ride can wait in the building and be with her in Preop/Recovery or pickup at [employer] curbside if not coming in the building.

## 2020-06-27 ENCOUNTER — Encounter: Payer: Self-pay | Admitting: Gastroenterology

## 2020-06-29 LAB — LAB USE ONLY - HISTORICAL SURGICAL PATHOLOGY

## 2020-08-17 ENCOUNTER — Encounter (INDEPENDENT_AMBULATORY_CARE_PROVIDER_SITE_OTHER): Payer: Self-pay | Admitting: Vascular Neurology

## 2020-09-08 ENCOUNTER — Ambulatory Visit (INDEPENDENT_AMBULATORY_CARE_PROVIDER_SITE_OTHER): Payer: Medicare Other | Admitting: Vascular Neurology

## 2020-09-15 ENCOUNTER — Encounter (INDEPENDENT_AMBULATORY_CARE_PROVIDER_SITE_OTHER): Payer: Self-pay

## 2020-09-16 ENCOUNTER — Encounter (INDEPENDENT_AMBULATORY_CARE_PROVIDER_SITE_OTHER): Payer: Self-pay

## 2020-10-12 ENCOUNTER — Other Ambulatory Visit (INDEPENDENT_AMBULATORY_CARE_PROVIDER_SITE_OTHER): Payer: Self-pay | Admitting: Internal Medicine

## 2020-10-12 DIAGNOSIS — I10 Essential (primary) hypertension: Secondary | ICD-10-CM

## 2020-10-16 ENCOUNTER — Encounter (INDEPENDENT_AMBULATORY_CARE_PROVIDER_SITE_OTHER): Payer: Self-pay

## 2020-10-17 ENCOUNTER — Encounter (INDEPENDENT_AMBULATORY_CARE_PROVIDER_SITE_OTHER): Payer: Self-pay

## 2020-10-17 ENCOUNTER — Ambulatory Visit (INDEPENDENT_AMBULATORY_CARE_PROVIDER_SITE_OTHER): Payer: Medicare Other | Admitting: Internal Medicine

## 2020-11-15 ENCOUNTER — Encounter (INDEPENDENT_AMBULATORY_CARE_PROVIDER_SITE_OTHER): Payer: Self-pay

## 2020-11-16 ENCOUNTER — Encounter (INDEPENDENT_AMBULATORY_CARE_PROVIDER_SITE_OTHER): Payer: Self-pay

## 2020-12-11 ENCOUNTER — Ambulatory Visit (FREE_STANDING_LABORATORY_FACILITY): Payer: Medicare Other | Admitting: Internal Medicine

## 2020-12-11 ENCOUNTER — Encounter (INDEPENDENT_AMBULATORY_CARE_PROVIDER_SITE_OTHER): Payer: Self-pay | Admitting: Internal Medicine

## 2020-12-11 VITALS — BP 136/76 | HR 73 | Temp 96.9°F | Ht 63.39 in | Wt 116.8 lb

## 2020-12-11 DIAGNOSIS — E7800 Pure hypercholesterolemia, unspecified: Secondary | ICD-10-CM

## 2020-12-11 DIAGNOSIS — I1310 Hypertensive heart and chronic kidney disease without heart failure, with stage 1 through stage 4 chronic kidney disease, or unspecified chronic kidney disease: Secondary | ICD-10-CM

## 2020-12-11 DIAGNOSIS — N181 Chronic kidney disease, stage 1: Secondary | ICD-10-CM

## 2020-12-11 DIAGNOSIS — E039 Hypothyroidism, unspecified: Secondary | ICD-10-CM

## 2020-12-11 DIAGNOSIS — I10 Essential (primary) hypertension: Secondary | ICD-10-CM

## 2020-12-11 DIAGNOSIS — Z0000 Encounter for general adult medical examination without abnormal findings: Secondary | ICD-10-CM

## 2020-12-11 DIAGNOSIS — M810 Age-related osteoporosis without current pathological fracture: Secondary | ICD-10-CM

## 2020-12-11 DIAGNOSIS — Z1231 Encounter for screening mammogram for malignant neoplasm of breast: Secondary | ICD-10-CM

## 2020-12-11 LAB — COMPREHENSIVE METABOLIC PANEL
ALT: 15 U/L (ref 0–55)
AST (SGOT): 18 U/L (ref 5–34)
Albumin/Globulin Ratio: 1.8 (ref 0.9–2.2)
Albumin: 4 g/dL (ref 3.5–5.0)
Alkaline Phosphatase: 38 U/L (ref 37–117)
Anion Gap: 9 (ref 5.0–15.0)
BUN: 14 mg/dL (ref 7.0–19.0)
Bilirubin, Total: 0.5 mg/dL (ref 0.2–1.2)
CO2: 31 mEq/L — ABNORMAL HIGH (ref 21–29)
Calcium: 9.2 mg/dL (ref 7.9–10.2)
Chloride: 100 mEq/L (ref 100–111)
Creatinine: 0.9 mg/dL (ref 0.4–1.5)
Globulin: 2.2 g/dL (ref 2.0–3.7)
Glucose: 86 mg/dL (ref 70–100)
Potassium: 4.9 mEq/L (ref 3.5–5.1)
Protein, Total: 6.2 g/dL (ref 6.0–8.3)
Sodium: 140 mEq/L (ref 136–145)

## 2020-12-11 LAB — CBC AND DIFFERENTIAL
Absolute NRBC: 0 10*3/uL (ref 0.00–0.00)
Basophils Absolute Automated: 0.03 10*3/uL (ref 0.00–0.08)
Basophils Automated: 0.5 %
Eosinophils Absolute Automated: 0.31 10*3/uL (ref 0.00–0.44)
Eosinophils Automated: 5 %
Hematocrit: 41.4 % (ref 34.7–43.7)
Hgb: 12.8 g/dL (ref 11.4–14.8)
Immature Granulocytes Absolute: 0.02 10*3/uL (ref 0.00–0.07)
Immature Granulocytes: 0.3 %
Lymphocytes Absolute Automated: 1.71 10*3/uL (ref 0.42–3.22)
Lymphocytes Automated: 27.7 %
MCH: 31 pg (ref 25.1–33.5)
MCHC: 30.9 g/dL — ABNORMAL LOW (ref 31.5–35.8)
MCV: 100.2 fL — ABNORMAL HIGH (ref 78.0–96.0)
MPV: 9.3 fL (ref 8.9–12.5)
Monocytes Absolute Automated: 0.65 10*3/uL (ref 0.21–0.85)
Monocytes: 10.5 %
Neutrophils Absolute: 3.45 10*3/uL (ref 1.10–6.33)
Neutrophils: 56 %
Nucleated RBC: 0 /100 WBC (ref 0.0–0.0)
Platelets: 331 10*3/uL (ref 142–346)
RBC: 4.13 10*6/uL (ref 3.90–5.10)
RDW: 13 % (ref 11–15)
WBC: 6.17 10*3/uL (ref 3.10–9.50)

## 2020-12-11 LAB — LIPID PANEL
Cholesterol / HDL Ratio: 2.9
Cholesterol: 204 mg/dL — ABNORMAL HIGH (ref 0–199)
HDL: 71 mg/dL (ref 40–9999)
LDL Calculated: 99 mg/dL (ref 0–99)
Triglycerides: 168 mg/dL — ABNORMAL HIGH (ref 34–149)
VLDL Calculated: 34 mg/dL (ref 10–40)

## 2020-12-11 LAB — TSH: TSH: 1.71 u[IU]/mL (ref 0.35–4.94)

## 2020-12-11 LAB — HEMOLYSIS INDEX: Hemolysis Index: 8 (ref 0–24)

## 2020-12-11 LAB — T4, FREE: T4 Free: 0.87 ng/dL (ref 0.70–1.48)

## 2020-12-11 LAB — GFR: EGFR: >60

## 2020-12-11 NOTE — Progress Notes (Signed)
Have you seen any specialists/other providers since your last visit with [place]?    Yes    Arm preference verified?   Yes    The patient is due for [employer] care plan letter,covid -19 booster shot .

## 2020-12-11 NOTE — Progress Notes (Signed)
[facility]            Katherine Mays is a 70 y.o. female who presents today for the following Medicare Wellness Visit:  []  Initial Preventive Physical Exam (IPPE) - "Welcome to Medicare" preventive visit (Vision Screening required)   []  Annual Wellness Visit - Initial  [x]  Annual Wellness Visit - Subsequent     Health Risk Assessment:   During the past month, how would you rate your general health?:  Good  Which of the following tasks can you do without assistance  drive or take the bus alone; shop for groceries or clothes; prepare your own meals; do your own housework/laundry; handle your own finances/pay bills; eat, bathe or get around your home?: Drive or take the bus alone,Handle your own finances/pay bills,Shop for groceries or clothes,Eat, bathe, dress or get around your home,Prepare your own meals,Do your own housework/laundry  Which of the following problems have you been bothered by in the past month  dizzy when standing up; problems using the phone; feeling tired or fatigued; moderate or severe body pain?: Dizzy when standing up,Feeling tired or fatigued,Moderate or severe body pain  Do you exercise for about 20 minutes 3 or more days per week?:Yes  During the past month was someone available to help if you needed and wanted help?  For example, if you felt nervous, lonely, got sick and had to stay in bed, needed someone to talk to, needed help with daily chores or needed help just taking care of yourself.: Yes  Do you always wear a seat belt?: Yes  Do you have any trouble taking medications the way you have been told to take them?: No  Have you been given any information that can help you with keeping track of your medications?: Yes  Do you have trouble paying for your medications?: No  Have you been given any information that can help you with hazards in your house, such as scatter rugs, furniture, etc?: Yes  Do you feel unsteady when standing or walking?: Yes  Do you  worry about falling?: No  Have you fallen two or more times in the past year?: Yes  Did you suffer any injuries from your falls in the past year?: Yes     Care Team:   Patient Care Team:  Clydell Hakim, MD as PCP - General (Internal Medicine)  Eddie North, MD as Consulting Physician (Nephrology)  Micael Hampshire Barry Dienes, MD as Consulting Physician (Rheumatology)      Hospitalizations:   Hospitalization within past year: [x]  No  []  Yes     Diagnosis:      Screenings:   Ambulatory Screenings 10/15/2019 12/27/2019 12/11/2020   Falls Risk: De [place] more than 2 times in past year N N Y   Falls Risk: Suffer any injuries? N N Y   Depression: PHQ2 Total Score 6 - 2   Depression: PHQ9 Total Score 6 - 2        Substance Use Disorder Screen:  In the past year, how often have you used the following?  1) Alcohol (For men, 5 or more drinks a day. For women, 4 or more drinks a day)  [x]  Never []  Once or Twice []  Monthly []  Weekly []  Daily or Almost Daily  2) Tobacco Products  [x]  Never []  Once or Twice []  Monthly []  Weekly []  Daily or Almost Daily  3) Prescription Drugs for Non-Medical Reasons  [x]  Never []  Once or Twice []  Monthly []  Weekly []   Daily or Almost Daily  4) Illegal Drugs  [x]  Never []  Once or Twice []  Monthly []  Weekly []  Daily or Almost Daily             Functional Ability/Level of Safety:   Falls Risk/Home Safety Assessment:  ( see HRA and Screenings sections for additional assessment)  Home Safety: [x]  Stair handrails  [x]  Skid-resistant rugs/remove throw rugs   []  Grab bars  [x]  Clear pathways between rooms  [x]  Proper lighting stairs/ bathrooms/bedrooms  Get Up and Go (optional):  [x]   <20 secs  []   >20 secs    []   High risk for falls - Home Safety/Falls Risk Precautions reviewed with pt/family    Hearing Assessment:  Concerns for hearing loss: []  Yes  [x]   No  Hearing aids:   []   Right  []   Left  []   Bilateral   []   None  Whisper Test (optional):  []  Normal  []   Slightly decreased  []   Significantly  decreased    Exercise:  Frequency:  []   No formal exercise  []   1-2x/wk  [x]   3-4x/wk  []   >4x/wk  Duration:  []   15-30 mins/day  [x]   30-45 mins/day  []   45+ mins/day  Intensity:  []   Light  []   Moderate  []   Heavy        Activities of Daily Living:   ADL's Independent Minimal  Assistance Moderate  Assistance Total   Assistance   Bathing [x]  []  []  []    Dressing [x]  []  []  []    Mobility   [x]  []  []  []    Transfer [x]  []  []  []    Eating [x]  []  []  []    Toileting [x]  []  []  []      IADL's Independent Minimal  Assistance Moderate  Assistance Total   Assistance   Phone [x]  []  []  []    Housekeeping [x]  []  []  []    Laundry [x]  []  []  []    Transportation [x]  []  []  []    Medications [x]  []  []  []    Finances [x]  []  []  []       ADL assistance: [x]  No assistance needed  []  Spouse  []  Sibling  []  Son   []  Daughter []  Children  []  Home Health Aide []  Other:       Advance Care Planning:   Discussion of Advance Directives:   []  Advance Directive in chart  []  Advance Directive not in chart - requested to provide [x]  No Advance Directive.  Form Provided  []  No Advance Directive.  Pt declines. []  Not addressed today  []  Other:     Exam:   BP 136/76 (BP Site: Right arm, Patient Position: Sitting, Cuff Size: Medium)    Pulse 73    Temp (!) 96.9 F (36.1 C) (Temporal)    Ht 1.61 m (5' 3.39")    Wt 53 kg (116 lb 12.8 oz)    SpO2 99%    BMI 20.44 kg/m      Physical Exam    Physical Examination: General appearance - alert, well appearing, and in no distress  Mental status - alert, oriented to person, place, and time  Chest - clear to auscultation, no wheezes, rales or rhonchi, symmetric air entry  Heart - normal rate, regular rhythm, normal S1, S2, no murmurs, rubs, clicks or gallops  Abdomen - soft, nontender, nondistended, no masses or organomegaly  Neurological - alert, oriented, normal speech, no focal findings or movement disorder noted  Skin - normal coloration and turgor,  no rashes, no suspicious skin lesions noted    Vision Screening  (Required for IPPE):     Uncorrected Near Uncorrected  Far   Right Eye 20/ 20/   Left Eye 20/ 20/   Both Eyes 20/ 20/      Corrected  Near Corrected  Far   Right Eye 20/ 20/20   Left Eye 20/ 20/25   Both Eyes 20/ 20/20     []  Patient refused vision screening       Evaluation of Cognitive Function:   Mood/affect: [x]  Appropriate  []   Other:   Appearance: [x]  Neatly groomed  [x]  Adequately nourished  []  Other:  Family member/caregiver input: []  Present - no concerns  []   Not present in room  []  Present - concerns:    Cognitive Assessment:  Mini-Cog Result (three word registration- banana, sunrise, chair / clock drawing):   []   > 3 points - negative screen for dementia   [x]  3 recalled words - negative screen for dementia   []  1-2 recalled words and normal clock draw - negative for cognitive impairment   []  1-2 recalled words and abnormal clock draw - positive for cognitive impairment   []  0 recalled words - positive for cognitive impairment         Assessment/Plan:   1. Medicare annual wellness visit, subsequent  - Mammo Digital Screening Bilateral W Cad; Future    2. Encounter for screening mammogram for malignant neoplasm of breast  - Mammo Digital Screening Bilateral W Cad; Future    3. Essential hypertension, benign  - Comprehensive metabolic panel  - CBC and differential    4. Pure hypercholesterolemia  - Comprehensive metabolic panel  - CBC and differential  - Lipid panel    5. Acquired hypothyroidism  - TSH  - T4, free    6. Senile osteoporosis    7. Stage 1 chronic kidney disease  - Comprehensive metabolic panel  - CBC and differential           Clydell Hakim, MD    12/11/2020    The following sections were reviewed this encounter by the provider:   Tobacco   Allergies   Meds   Problems   Med Hx   Surg Hx   Fam Hx   Soc Hx           History:   Patient Active Problem List   Diagnosis    Senile osteoporosis    Chronic kidney disease    Depression    Essential hypertension, benign    Hypothyroidism     Chronic daily headache    Chronic migraine    Cervicalgia    Cervical spine disease    Sleep difficulties    Unrefreshed by sleep    Daytime sleepiness    History of depression    Chronic right-sided low back pain without sciatica    Right hip pain    History of low back pain - x 25+ years    Mild sleep apnea    Medication management    Periodic limb movements of sleep      Past Medical History:   Diagnosis Date    Anemia     Hx, stable at present (01/07/2020).    Anxiety     Aortic valve insufficiency     per Echo 12/28/2019    Atrial septal aneurysm     Per Echo 1/49/2021    Broken foot     right foot  and pinky fingers.    Cervical spine pain     Chronic kidney disease, stage I 06/2013    Claustrophobia     Constipation     Depression age 47     followed by Psychiatrist.     Edema     left leg if sitting too long., seen by Vascular possibly r/t nerve      Encounter for blood transfusion 1986    with pregnancy high risk with bleeding. No issues known.    Has immunity to COVID-19 virus     Pfizer  4/15 3/11     History of positive PPD     CXRAYs all WNL     Hyperlipidemia     diet controlled     Hypertension     Labile HTN ~150/85-90 to 120/80 per pt. Pt states PCP monitored. White Coat Syndrome     Hypothyroidism     stable on med per pt.    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    TONSILLECTOMY      WISDOM TOOTH EXTRACTION       Allergies   Allergen Reactions    Latex Itching    Keflex [Cephalexin] Itching and Rash    Nickel Itching and Rash      Outpatient Medications Marked as Taking for the 12/11/20 encounter (Office Visit) with Clydell Hakim, MD   Medication Sig Dispense Refill    acetaminophen (TYLENOL) 500 MG tablet Take 1,000 mg by mouth every 6 (six) hours as needed for Pain      CALCIUM-VITAMIN D PO Take by mouth 2 (two) times daily      Cyanocobalamin (B-12 PO) daily sublingual      Desvenlafaxine ER 100 MG Tablet SR 24 hr Take 100 mg by mouth every morning         dilTIAZem (CARDIZEM CD) 180 MG 24 hr capsule Take 1 capsule (180 mg total) by mouth every morning 90 capsule 1    levothyroxine (SYNTHROID) 75 MCG tablet Take 75 mcg by mouth Once a day at 6:00am Only 6 times a week- no dose in Sunday.        PROLIA 60 MG/ML Solution subcutaneous injection every 6 (six) months         Riboflavin (B2) 100 MG Tab Take 100 mg by mouth daily         traMADol (ULTRAM) 50 MG  tablet Take by mouth every 6 (six) hours as needed 1-2 tablets PRN        valACYclovir HCL (VALTREX) 500 MG tablet Take 500 mg by mouth as needed          Social History     Tobacco Use    Smoking status: Never Smoker    Smokeless tobacco: Never Used   Vaping Use    Vaping Use: Never used   Substance Use Topics    Alcohol use: Yes     Comment: 3-5 drinks per week avg    Drug use: No      Family History   Problem Relation Age of Onset    Thyroid disease Mother     Diabetes Father     Heart disease Father 52        M I     Osteoporosis Father     Stroke Father            ===================================================================    Additional Documentation:

## 2020-12-11 NOTE — Patient Instructions (Signed)
MEDICARE WELLNESS PERSONAL PREVENTION PLAN   As part of the Medicare Wellness portion of your visit today, we are providing you with this personalized preventative plan of care. We have listed below some of the preventative services that are recommended for patients based upon their age and gender. These recommendations are taken directly from the [place] [employer] (USPSTF) and the [employer] (ACIP).     Health Maintenance   Topic Date Due    Advance Directive on File  Never done    COVID-19 Vaccine (3 - Booster for Pfizer series) 10/19/2020    MAMMOGRAM  10/28/2020    FALLS RISK ANNUAL  12/11/2021    DEPRESSION SCREENING  12/11/2021    [facility] CARE PLAN LETTER  12/11/2021    Medicare Annual Wellness Visit  12/11/2021    COLONOSCOPY TEN YEARS  06/26/2030    HEPATITIS C SCREENING  Completed    INFLUENZA VACCINE  Completed    DXA Scan  Completed    Shingrix Vaccine 50+  Completed    Pneumonia Vaccine Age 30+  Completed       Health Maintenance Topics with due status: Overdue       Topic Date Due    Advance Directive on File Never done    COVID-19 Vaccine 10/19/2020     Health Maintenance Topics with due status: Due On       Topic Date Due    MAMMOGRAM 10/28/2020        Immunization History   Administered Date(s) Administered    COVID-19 mRNA Vaccine Preservative Free 0.3 mL (PFIZER) 03/23/2020, 04/18/2020    INFLUENZA HIGH DOSE 65 YRS+ 09/08/2014, 11/08/2018, 10/04/2019, 10/11/2019    INFLUENZA HIGH DOSE 65 YRS+ Quad 0.7 mL 08/09/2020    Influenza (Im) Preserved TRIVALENT VACCINE 10/01/2013    Influenza Quad Im 6-35 Mos preserved 09/09/2015, 09/08/2016    Influenza quadrivalent (IM) 6 months & up PRESERVED (Afluria/Fluzone) 08/09/2010, 10/12/2014    Pneumococcal 23 valent 10/21/2016    Pneumococcal Conjugate 13-Valent 10/10/2015    Tdap 10/01/2013    Zoster (SHINGRIX) Vaccine Recombinant 10/12/2019, 12/28/2019    Zoster (ZOSTAVAX)  Vaccine 10/01/2013        Your major risk factors: Hypertension, Osteoporosis, Osteoarthritis and Falls Risk  Recommendations for improvement:  Elevated Cholesterol/Low Salt Diet - Optimize adherence with low cholesterol diet.  Here is a good reference website for tips on healthy eating and ways to lower your cholesterol -  (NoCareers.gl)  Exercise - Engage in age appropriate exercise 150 minutes per week   Hypertension - Monitor your blood pressure daily. Here is a good reference website for tips on managing hypertension - MotivationalSites.no  Falls Prevention - Here is a good reference website for tips on avoiding falls - BikingRewards.pl    The list below includes many common screening recommendations but is not meant to be comprehensive. You may be eligible for other preventative services depending upon your personal risk factors.    Colorectal Cancer Screening  All adults age 88-75 should undergo periodic colorectal cancer screening. The decision to screen for colorectal cancer in adults aged 68 to 34 years should be an individual one,taking into account your overall health and prior screening history.    Breast Cancer Screening  Women age 74-74 should have mammograms every other year (please note that this recommendation may not be appropriate for every woman - your physician can answer specific questions you may have). The USPSTF concludes that the current  evidence is insufficient to assess the balance of benefits and harms of screening mammography in women aged 31 years or older.     Cervical Cancer Screening  Women over 89 do not require pap smears as long as prior screening has been normal and are not otherwise at high risk for cervical cancer. For women aged 58 to 72 years, the USPSTF recommends screening every 3 years with cervical cytology alone, every 5 years with  high-risk human papillomavirus (hrHPV) testing alone, or every 5 years with hrHPV testing in combination with cytology (cotesting).    Osteoporosis Screening   The USPSTF recommends screening for osteoporosis with bone measurement testing to prevent osteoporotic fractures in women 65 years and older.   Hepatitis C Screening  Recommend screening for hepatitis C virus (HCV) infection in all adults aged 17 to 10 years.   Lung cancer Screening - Recommend annual screening for lung cancer with low-dose computed tomography (LDCT) in adults ages 41 to 23 years who have a 20 pack-year smoking history and currently smoke or have quit within the past 15 years.   Recommended Vaccinations    Influenza one dose annually    Tetanus/diphtheria one booster every 10 years    Zoster/Shingles - Shingrix two doses after age 16 (second dose given 2-6 months after first dose)   Pneumovax (PPSV23) one dose for adults aged ?65 years   Prevnar(PCV13) shared clinical decision-making is recommended regarding administration of this vaccine to persons aged ?65 years who do not have an immunocompromising condition, cerebrospinal fluid leak, or cochlear implant.     PERSONAL PREVENTION PLAN   Your Personal Prevention Plan is based on your overall health and your responses to the health questionnaire you completed. The following information is for you to review in addition to the recommendations, referrals, and tests we have discussed at your visit.     Physical Activity:   Physical activity can help you maintain a healthy weight, prevent or control illness, reduce stress, and sleep better. It can also help you improve your balance to avoid falls. Try to build up to and maintain a total of 30 minutes of activity each day. If you are able, try walking, doing yard or housework, and taking the stairs more often. You can also strengthen your muscles with exercises done while sitting or lying down.   Emotional Health:   Feeling down in the  dumps or anxious every now and then is a natural part of life. If this feeling lasts for a few weeks or more, talk with me as soon as possible. It could be a sign of a problem that needs treatment. There are many types of treatment available.   Falls:   You can reduce your risk of falling by making changes in your home. Remove items that may cause tripping, improve lighting, and consider installing grab bars.   Talk with me if you have problems with balance and walking. To prevent falls, you may need your vision, hearing, or blood pressure checked. Exercises to improve your strength and balance, or using a cane or walker, may help. Review your medicines with me at every visit, because some can affect balance. Please be sure to let me know if you fall or are fearful you may fall.   Urinary Leakage:   Urine leakage is common, but it is not a normal part of aging. Talk with me about any urine leakage so that the cause can be found and treated. Treatment can include bladder  training, exercises, medicine or surgery.   Pain:   We all have aches and pains at times, but chronic pain can change how you feel and live every day. Please talk with me about any symptoms of chronic pain so that we can determine how best to treat.   Sleep:   Getting a good nights sleep is vital to your health and well-being and can help prevent or manage health problems. Often, sleep can be improved by changing behaviors, including when you go to bed and what you do before bed. Sleep apnea can cause problems such as struggling to stay awake during the day. Please let me know if you would like to learn more about improving your sleep and/or think you may have sleep apnea.   Seat Belt:   Please remember to wear a seat belt when driving or riding in a vehicle. It is one of the most important things you can do to stay safe in a car.   Nutrition:   Remember to eat plenty of fruits, vegetables, whole grains, and dairy. Drink at least 64 ounces (8 full  glasses) of water a day, unless you have been advised to limit fluids.   Alcohol:   Alcohol can have a greater effect on older people, who may feel its effects at a lower amount. Older people should limit alcoholic drinks (no more than one a day for women and no more than two a day for men). Please let me know if alcohol use becomes a problem.   Tobacco:   Not smoking or using other forms of tobacco is one of the most important things you can do for your health. Here is some more information about the importance of quitting smoking and how to quit smoking - BroadJournal.com.pt  Advance Directives:   There may come a time when medical decisions need to be made on your behalf. Please talk with your family, and with me, about your wishes. It is important to provide information about your decisions, and any formal advance directives, for your medical record. Here is additional information on advanced directives - http://wilson-mayo.[email or web address]  Additional Support:   Sometimes it can be challenging to manage all aspects of daily life. Finding the right support can help you maintain or improve your health and independence. Please let me know if you would like to talk further about finding resources to assist you.

## 2020-12-16 ENCOUNTER — Encounter (INDEPENDENT_AMBULATORY_CARE_PROVIDER_SITE_OTHER): Payer: Self-pay

## 2020-12-21 ENCOUNTER — Other Ambulatory Visit (INDEPENDENT_AMBULATORY_CARE_PROVIDER_SITE_OTHER): Payer: Self-pay | Admitting: Vascular Neurology

## 2020-12-22 ENCOUNTER — Encounter (INDEPENDENT_AMBULATORY_CARE_PROVIDER_SITE_OTHER): Payer: Self-pay

## 2021-01-06 ENCOUNTER — Other Ambulatory Visit: Payer: Self-pay | Admitting: Internal Medicine

## 2021-01-09 ENCOUNTER — Other Ambulatory Visit (INDEPENDENT_AMBULATORY_CARE_PROVIDER_SITE_OTHER): Payer: Self-pay

## 2021-01-09 DIAGNOSIS — Z1231 Encounter for screening mammogram for malignant neoplasm of breast: Secondary | ICD-10-CM

## 2021-01-09 DIAGNOSIS — Z0000 Encounter for general adult medical examination without abnormal findings: Secondary | ICD-10-CM

## 2021-01-15 ENCOUNTER — Encounter (INDEPENDENT_AMBULATORY_CARE_PROVIDER_SITE_OTHER): Payer: Self-pay | Admitting: Internal Medicine

## 2021-01-16 ENCOUNTER — Encounter (INDEPENDENT_AMBULATORY_CARE_PROVIDER_SITE_OTHER): Payer: Self-pay

## 2021-01-18 ENCOUNTER — Ambulatory Visit (INDEPENDENT_AMBULATORY_CARE_PROVIDER_SITE_OTHER): Payer: Medicare Other | Admitting: Internal Medicine

## 2021-01-19 ENCOUNTER — Encounter (INDEPENDENT_AMBULATORY_CARE_PROVIDER_SITE_OTHER): Payer: Self-pay

## 2021-01-19 ENCOUNTER — Ambulatory Visit (INDEPENDENT_AMBULATORY_CARE_PROVIDER_SITE_OTHER): Payer: Medicare Other | Admitting: Internal Medicine

## 2021-01-22 ENCOUNTER — Encounter (INDEPENDENT_AMBULATORY_CARE_PROVIDER_SITE_OTHER): Payer: Self-pay | Admitting: Internal Medicine

## 2021-01-22 ENCOUNTER — Ambulatory Visit (INDEPENDENT_AMBULATORY_CARE_PROVIDER_SITE_OTHER): Payer: Medicare Other | Admitting: Internal Medicine

## 2021-01-22 VITALS — BP 138/80 | HR 66 | Temp 97.3°F | Ht 62.13 in | Wt 117.4 lb

## 2021-01-22 DIAGNOSIS — F331 Major depressive disorder, recurrent, moderate: Secondary | ICD-10-CM

## 2021-01-22 DIAGNOSIS — B001 Herpesviral vesicular dermatitis: Secondary | ICD-10-CM

## 2021-01-22 DIAGNOSIS — F99 Mental disorder, not otherwise specified: Secondary | ICD-10-CM

## 2021-01-22 DIAGNOSIS — F5105 Insomnia due to other mental disorder: Secondary | ICD-10-CM

## 2021-01-22 DIAGNOSIS — G43009 Migraine without aura, not intractable, without status migrainosus: Secondary | ICD-10-CM

## 2021-01-22 MED ORDER — BUTALBITAL-APAP-CAFFEINE 50-325-40 MG PO TABS
1.0000 | ORAL_TABLET | Freq: Four times a day (QID) | ORAL | 0 refills | Status: AC | PRN
Start: 2021-01-22 — End: ?

## 2021-01-22 MED ORDER — DESVENLAFAXINE ER 100 MG PO TB24
100.0000 mg | ORAL_TABLET | Freq: Every morning | ORAL | 1 refills | Status: DC
Start: 2021-01-22 — End: 2021-08-29

## 2021-01-22 MED ORDER — MIRTAZAPINE 15 MG PO TABS
15.0000 mg | ORAL_TABLET | Freq: Every evening | ORAL | 0 refills | Status: DC
Start: 2021-01-22 — End: 2021-04-06

## 2021-01-22 MED ORDER — VALACYCLOVIR HCL 500 MG PO TABS
1000.0000 mg | ORAL_TABLET | Freq: Two times a day (BID) | ORAL | 2 refills | Status: AC | PRN
Start: 2021-01-22 — End: ?

## 2021-01-22 NOTE — Progress Notes (Signed)
Controlled substance prescriptions data review:    I have reviewed prescription data from [place] prescription monitoring program.  I have considered these data and counseled the patient prior to prescribing controlled substances to this patient.  I have also counseled the patient regarding risks and benefits as well as proper storage of prescribed controlled substances.    Clydell Hakim, MD, MD   01/22/2021

## 2021-01-22 NOTE — Progress Notes (Signed)
[facility]  [place]  PROGRESS NOTE      Patient: Katherine Mays   Date: 01/22/2021   MRN: [id number]     ASSESSMENT/PLAN     Cieanna Stormes is a 70 y.o. female    Chief Complaint   Patient presents with    headache    Panic Attack    Rash    Anxiety        1. Migraine without aura and without status migrainosus, not intractable  Patient is experiencing a throbbing frontal headache for last few 2 weeks.  She has difficulty in sleeping at nighttime due to significant stress of moving.  She is moving to [place] to take care of her mom.  Recommended following medication for the headache.  She is also on gabapentin.    - butalbital-acetaminophen-caffeine (Esgic) 50-325-40 MG per tablet; Take 1 tablet by mouth every 6 (six) hours as needed for Headaches  Dispense: 60 tablet; Refill: 0    2. Recurrent herpes labialis  Following refill is given for recurrent herpes labialis.  She will use it for 5 days during each flareup.  - valACYclovir HCL (VALTREX) 500 MG tablet; Take 2 tablets (1,000 mg total) by mouth 2 (two) times daily as needed (for fever bloster)  Dispense: 30 tablet; Refill: 2    3. Major depressive disorder, recurrent episode, moderate  I added a Remeron 15 mg nightly because of increasing anxiety as well as insomnia.  Continue other medications and refills are given.  - Desvenlafaxine ER 100 MG Tablet SR 24 hr; Take 1 tablet (100 mg total) by mouth every morning  Dispense: 90 tablet; Refill: 1  - mirtazapine (Remeron) 15 MG tablet; Take 1 tablet (15 mg total) by mouth nightly  Dispense: 90 tablet; Refill: 0    4. Insomnia due to other mental disorder  As above  - mirtazapine (Remeron) 15 MG tablet; Take 1 tablet (15 mg total) by mouth nightly  Dispense: 90 tablet; Refill: 0           MEDICATIONS     Current Outpatient Medications   Medication Sig Dispense Refill    acetaminophen (TYLENOL) 500 MG tablet Take 1,000 mg by mouth every 6 (six) hours as needed for Pain      CALCIUM-VITAMIN D PO  Take by mouth 2 (two) times daily      Cyanocobalamin (B-12 PO) daily sublingual      Desvenlafaxine ER 100 MG Tablet SR 24 hr Take 1 tablet (100 mg total) by mouth every morning 90 tablet 1    dilTIAZem (CARDIZEM CD) 180 MG 24 hr capsule Take 1 capsule (180 mg total) by mouth every morning 90 capsule 1    gabapentin (NEURONTIN) 300 MG capsule TAKE 2 CAPSULES TWICE A DAY, CAN USE AN ADDITIONAL 1 CAPSULE AT BEDTIME AS NEEDED 450 capsule 0    levothyroxine (SYNTHROID) 75 MCG tablet Take 75 mcg by mouth Once a day at 6:00am Only 6 times a week- no dose in Sunday.        PROLIA 60 MG/ML Solution subcutaneous injection every 6 (six) months         Riboflavin (B2) 100 MG Tab Take 100 mg by mouth daily         traMADol (ULTRAM) 50 MG tablet Take by mouth every 6 (six) hours as needed 1-2 tablets PRN        valACYclovir HCL (VALTREX) 500 MG tablet Take 2 tablets (1,000 mg total) by mouth 2 (  two) times daily as needed (for fever bloster) 30 tablet 2    butalbital-acetaminophen-caffeine (Esgic) 50-325-40 MG per tablet Take 1 tablet by mouth every 6 (six) hours as needed for Headaches 60 tablet 0    diazePAM (VALIUM PO) Take by mouth once Unknown dose, Dr Faylene Million was to call this into pts Pharmacy preop to be taken AM DOS.        mirtazapine (Remeron) 15 MG tablet Take 1 tablet (15 mg total) by mouth nightly 90 tablet 0    Rimegepant Sulfate (Nurtec) 75 MG Tablet Dispersible Take 75 mg by mouth daily as needed (migraine/headache) 8 tablet 3     No current facility-administered medications for this visit.       Allergies   Allergen Reactions    Latex Itching    Keflex [Cephalexin] Itching and Rash    Nickel Itching and Rash       SUBJECTIVE     Chief Complaint   Patient presents with    headache    Panic Attack    Rash    Anxiety        Rash  This is a recurrent problem. The current episode started more than 1 year ago. The problem has been waxing and waning since onset. The affected locations include the lips.  The rash is characterized by blistering, burning, redness and swelling. She was exposed to nothing. Associated symptoms include fatigue and joint pain. Pertinent negatives include no cough, facial edema, fever or shortness of breath. Past treatments include antibiotics. The treatment provided moderate relief.   Anxiety  Presents for initial visit. Onset was more than 5 years ago. The problem has been gradually worsening. Symptoms include decreased concentration, excessive worry, insomnia, muscle tension, nervous/anxious behavior, palpitations, panic and restlessness. Patient reports no dizziness, shortness of breath or suicidal ideas. Symptoms occur most days. The severity of symptoms is moderate. The symptoms are aggravated by family issues.     Her past medical history is significant for anxiety/panic attacks and depression. Past treatments include non-SSRI antidepressants. The treatment provided moderate relief. Compliance with prior treatments has been variable.   Headache   This is a recurrent problem. The current episode started 1 to 4 weeks ago. The problem occurs intermittently. The problem has been waxing and waning. The pain is located in the frontal and bilateral region. The quality of the pain is described as throbbing. The pain is at a severity of 7/10. The pain is moderate. Associated symptoms include back pain, insomnia, photophobia and scalp tenderness. Pertinent negatives include no coughing, dizziness, fever or numbness. The symptoms are aggravated by emotional stress and fatigue. She has tried acetaminophen for the symptoms. The treatment provided no relief.           ROS     Review of Systems   Constitutional: Positive for fatigue. Negative for fever.   Eyes: Positive for photophobia.   Respiratory: Negative for cough, shortness of breath and stridor.    Cardiovascular: Positive for palpitations.   Musculoskeletal: Positive for arthralgias, back pain, joint pain and myalgias.   Skin: Positive for  rash.   Neurological: Positive for headaches. Negative for dizziness, tremors, syncope, facial asymmetry, light-headedness and numbness.   Psychiatric/Behavioral: Positive for decreased concentration and sleep disturbance. Negative for behavioral problems, self-injury and suicidal ideas. The patient is nervous/anxious and has insomnia.    All other systems reviewed and are negative.      Patient Active Problem List   Diagnosis  Senile osteoporosis    Chronic kidney disease    Depression    Essential hypertension, benign    Hypothyroidism    Chronic daily headache    Chronic migraine    Cervicalgia    Cervical spine disease    Sleep difficulties    Unrefreshed by sleep    Daytime sleepiness    History of depression    Chronic right-sided low back pain without sciatica    Right hip pain    History of low back pain - x 25+ years    Mild sleep apnea    Medication management    Periodic limb movements of sleep    Major depressive disorder, recurrent episode, moderate       Past Medical History:   Diagnosis Date    Anemia     Hx, stable at present (01/07/2020).    Anxiety     Aortic valve insufficiency     per Echo 12/28/2019    Atrial septal aneurysm     Per Echo 1/49/2021    Broken foot     right foot and pinky fingers.    Cervical spine pain     Chronic kidney disease, stage I 06/2013    Claustrophobia     Constipation     Depression age 50     followed by Psychiatrist.     Edema     left leg if sitting too long., seen by Vascular possibly r/t nerve      Encounter for blood transfusion 1986    with pregnancy high risk with bleeding. No issues known.    Has immunity to COVID-19 virus     Pfizer  4/15 3/11     History of positive PPD     CXRAYs all WNL     Hyperlipidemia     diet controlled     Hypertension     Labile HTN ~150/85-90 to 120/80 per pt. Pt states PCP monitored. White Coat Syndrome     Hypothyroidism     stable on med per pt.    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      PHYSICAL EXAM     Vitals:    01/22/21 1339 01/22/21 1340 01/22/21 1403 01/22/21 1404   BP: 164/88 165/88 140/72 138/80   BP Site: Left arm Right arm Left arm Right arm   Patient Position: Sitting Sitting Sitting Sitting   Cuff Size: Medium Medium Medium Medium   Pulse: 69 66     Temp: 97.3 F (36.3 C)      TempSrc: Temporal      SpO2: 96%      Weight: 53.3 kg (117 lb 6.4 oz)      Height: 1.578 m (5' 2.13")  Physical Exam  Vitals reviewed.   Constitutional:       Appearance: Normal appearance. She is normal weight.   Eyes:      Extraocular Movements: Extraocular movements intact.      Conjunctiva/sclera: Conjunctivae normal.      Pupils: Pupils are equal, round, and reactive to light.   Cardiovascular:      Rate and Rhythm: Normal rate.      Pulses: Normal pulses.      Heart sounds: Normal heart sounds.   Pulmonary:      Effort: Pulmonary effort is normal.      Breath sounds: Normal breath sounds.   Musculoskeletal:      Cervical back: Normal range of motion and neck supple.   Skin:     General: Skin is warm and dry.   Neurological:      General: No focal deficit present.      Mental Status: She is alert and oriented to person, place, and time.   Psychiatric:         Attention and Perception: Attention normal.         Mood and Affect: Mood is anxious.         Cognition and Memory: Cognition normal.         Judgment: Judgment normal.                Signed,  Clydell Hakim, MD, MD  01/22/2021

## 2021-01-22 NOTE — Progress Notes (Signed)
Have you seen any specialists/other providers since your last visit with us?    Yes    Arm preference verified?   Yes    The patient is due for advance directive on file,covid-19 booster shot

## 2021-01-24 ENCOUNTER — Telehealth (INDEPENDENT_AMBULATORY_CARE_PROVIDER_SITE_OTHER): Payer: Self-pay | Admitting: Internal Medicine

## 2021-01-24 NOTE — Telephone Encounter (Signed)
PA was initiated for the medication Butalbital-APAP-Caffeine 50-325-40MG  tablets on 01/24/2021    PA outcome -Request was approved

## 2021-04-06 ENCOUNTER — Other Ambulatory Visit (INDEPENDENT_AMBULATORY_CARE_PROVIDER_SITE_OTHER): Payer: Self-pay | Admitting: Internal Medicine

## 2021-04-06 DIAGNOSIS — F99 Mental disorder, not otherwise specified: Secondary | ICD-10-CM

## 2021-04-06 DIAGNOSIS — I10 Essential (primary) hypertension: Secondary | ICD-10-CM

## 2021-04-06 DIAGNOSIS — F331 Major depressive disorder, recurrent, moderate: Secondary | ICD-10-CM

## 2021-05-16 ENCOUNTER — Encounter (INDEPENDENT_AMBULATORY_CARE_PROVIDER_SITE_OTHER): Payer: Self-pay

## 2021-05-17 ENCOUNTER — Encounter (INDEPENDENT_AMBULATORY_CARE_PROVIDER_SITE_OTHER): Payer: Self-pay

## 2021-05-17 IMAGING — CR FOOT LT 3 VWS MIN
1 series · 3 of 3 positions shown · non-contrast
Comparison: None

HISTORY/INDICATIONS:   Left foot pain. Patient not able to bear weight.
TECHNIQUE: Left foot 3 views

[Series 1: x foot ap left · 0.15mm/px · 3 of 3 slices shown]
[im 1/3]
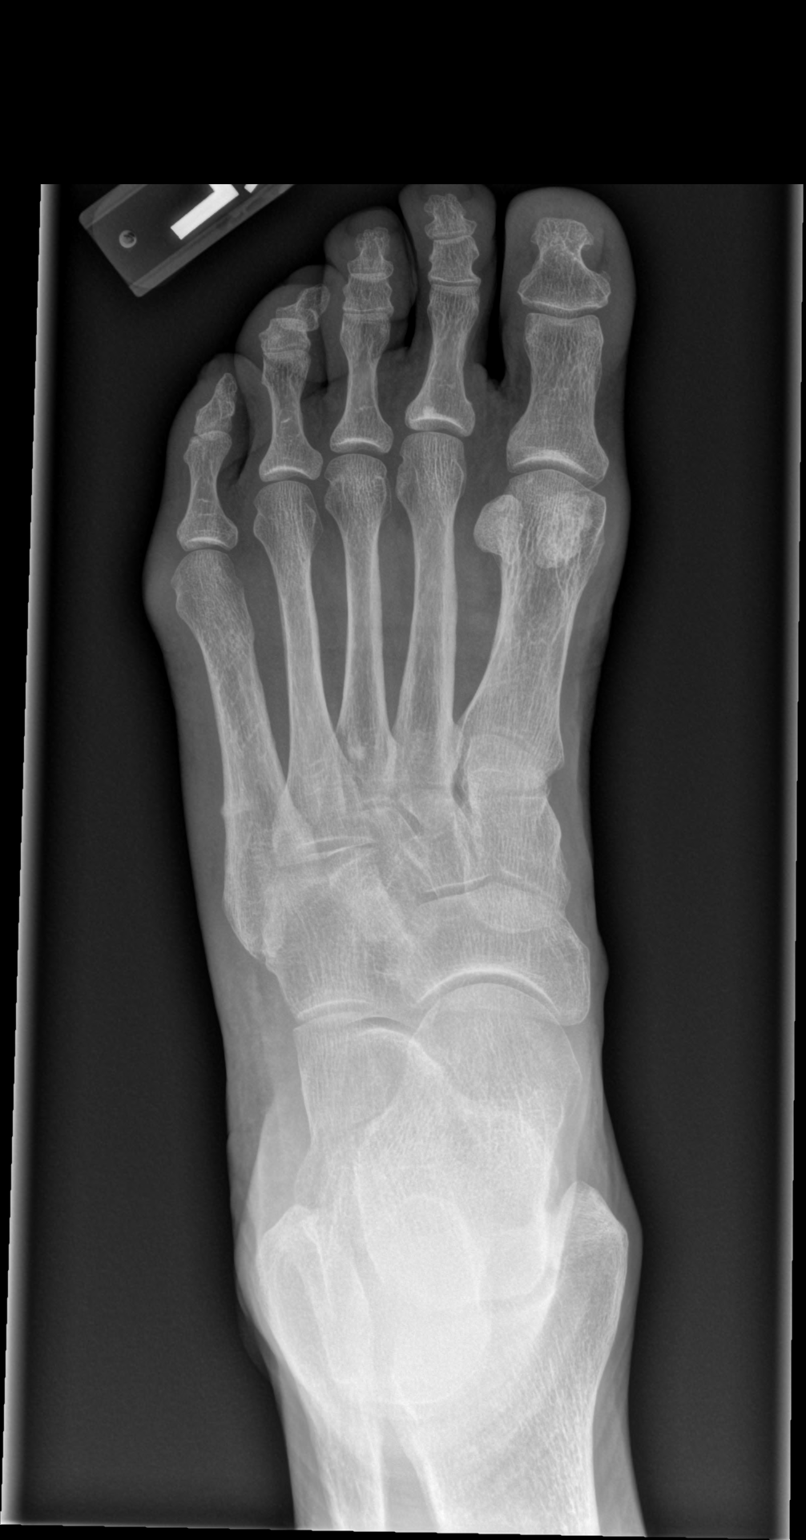
[im 2/3]
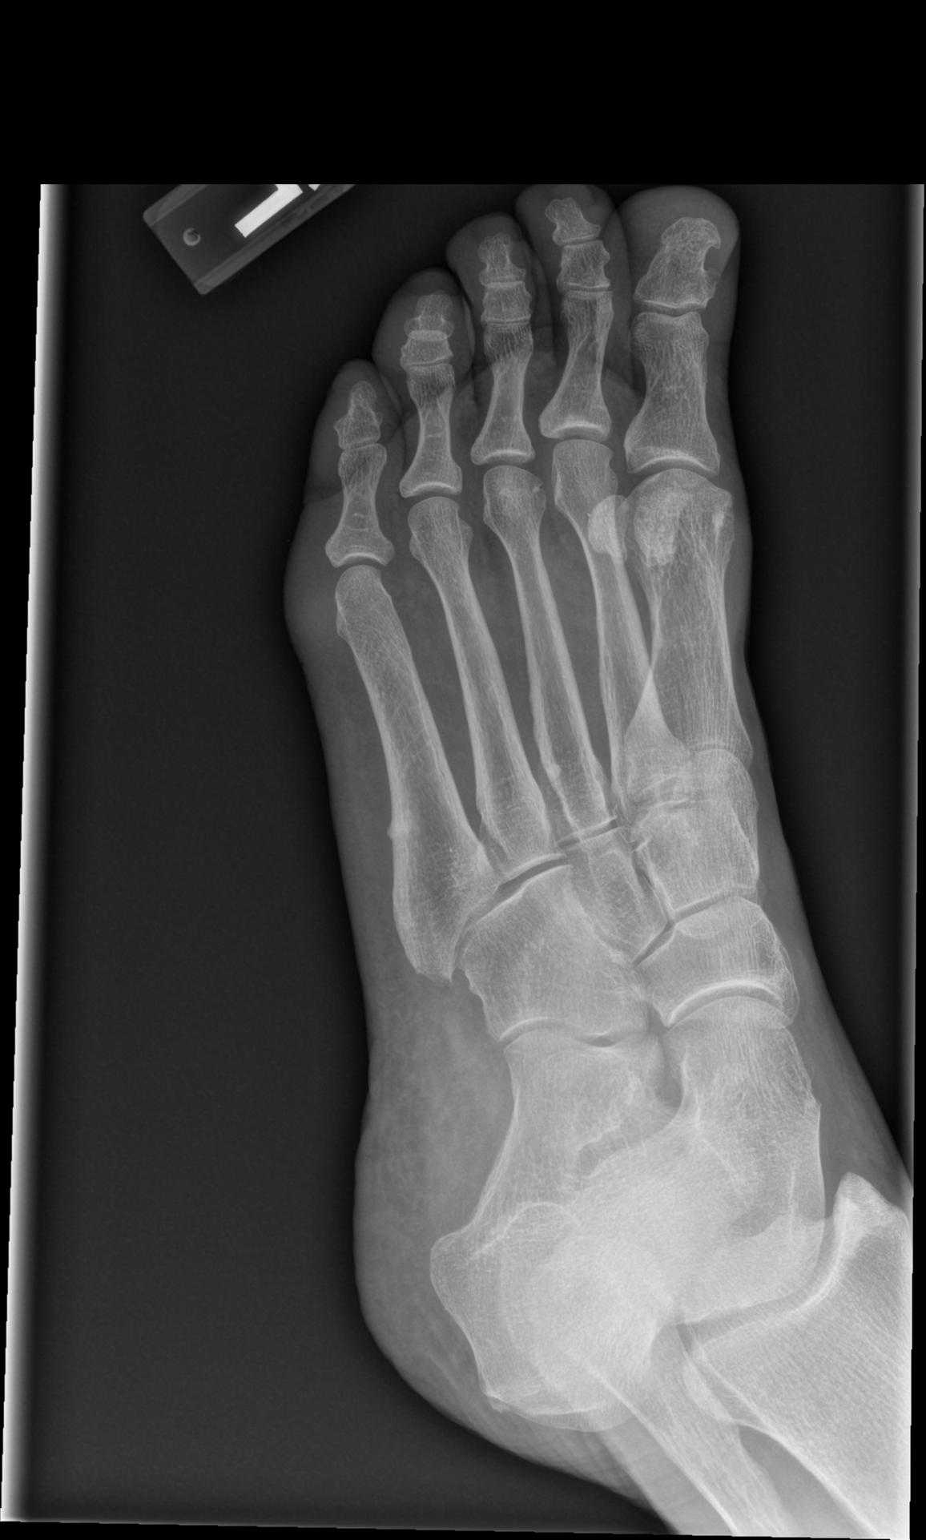
[im 3/3]
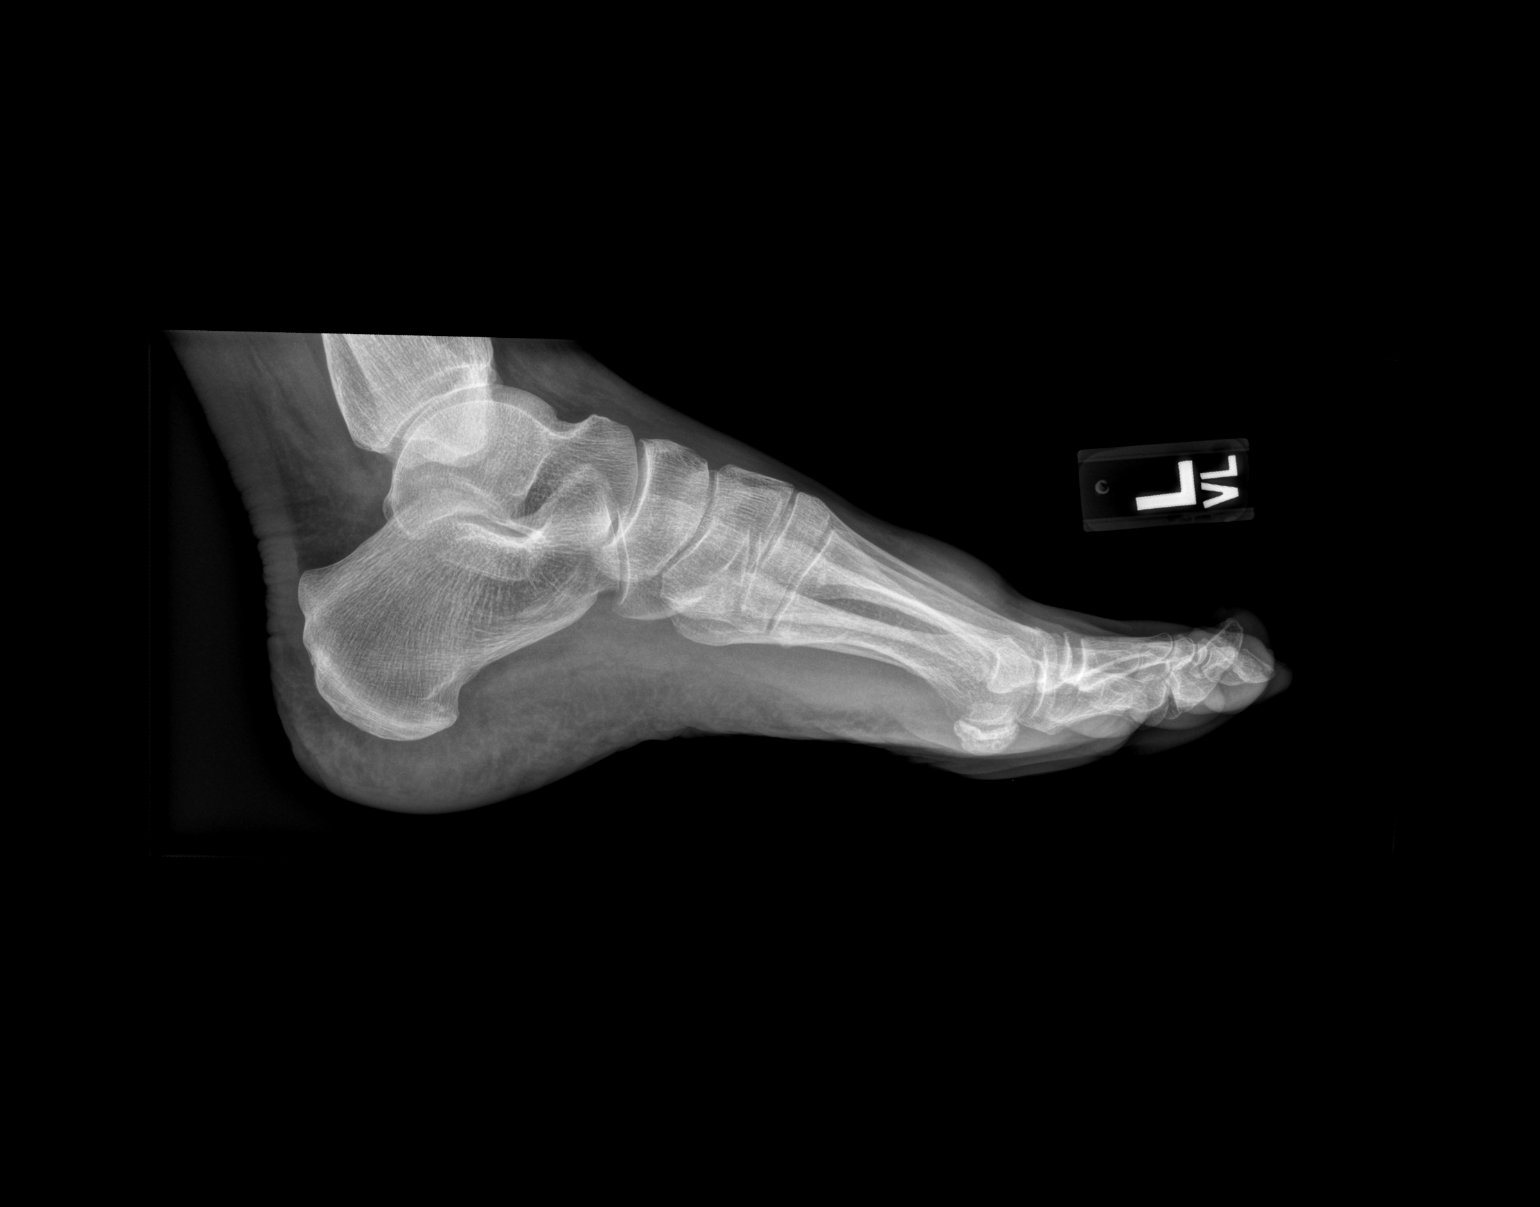

[3 of 3 positions shown; findings below may reference images not displayed]

FINDINGS: There is no visible fracture or localized osseous defect. Joint spaces are normal. There is no periosteal new bone formation to suggest stress fracture. The talus and os calcis are unremarkable.
IMPRESSION: Normal left foot. If stress fracture is a significant consideration MRI of the foot may be appropriate because of its increased sensitivity.

## 2021-05-17 IMAGING — CR ANKLE LT 3 VWS MIN
1 series · 3 of 3 positions shown · non-contrast
Comparison: None.

HISTORY: Left ankle pain.
TECHNIQUE: 3 view series left ankle.

[Series 1: x ankle ap left · 0.15mm/px · 3 of 3 slices shown]
[im 1/3]
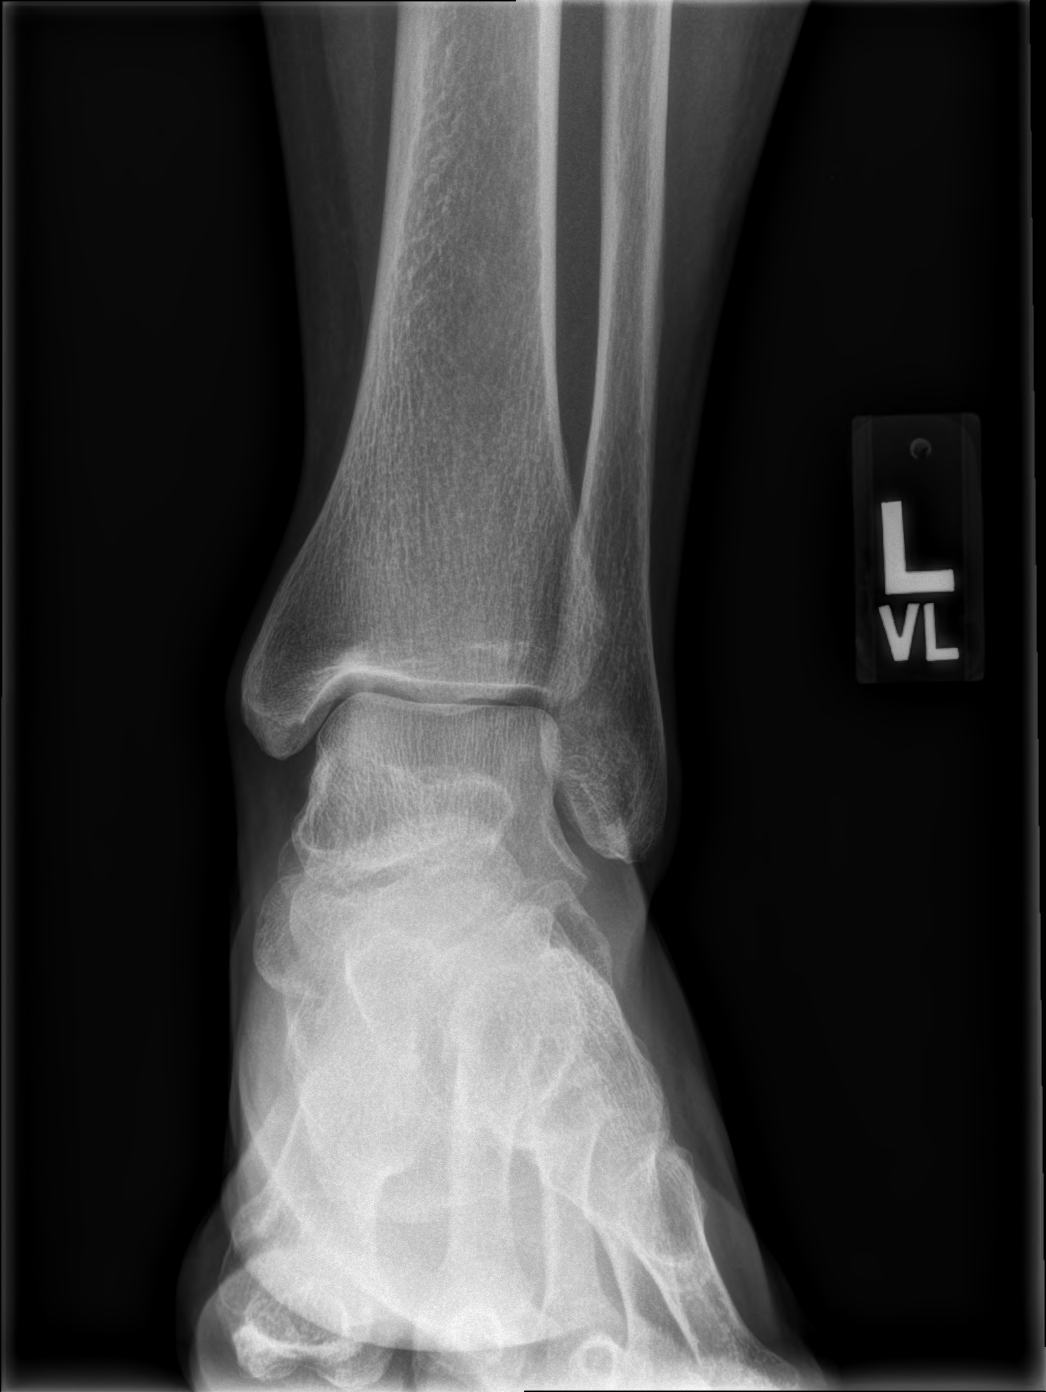
[im 2/3]
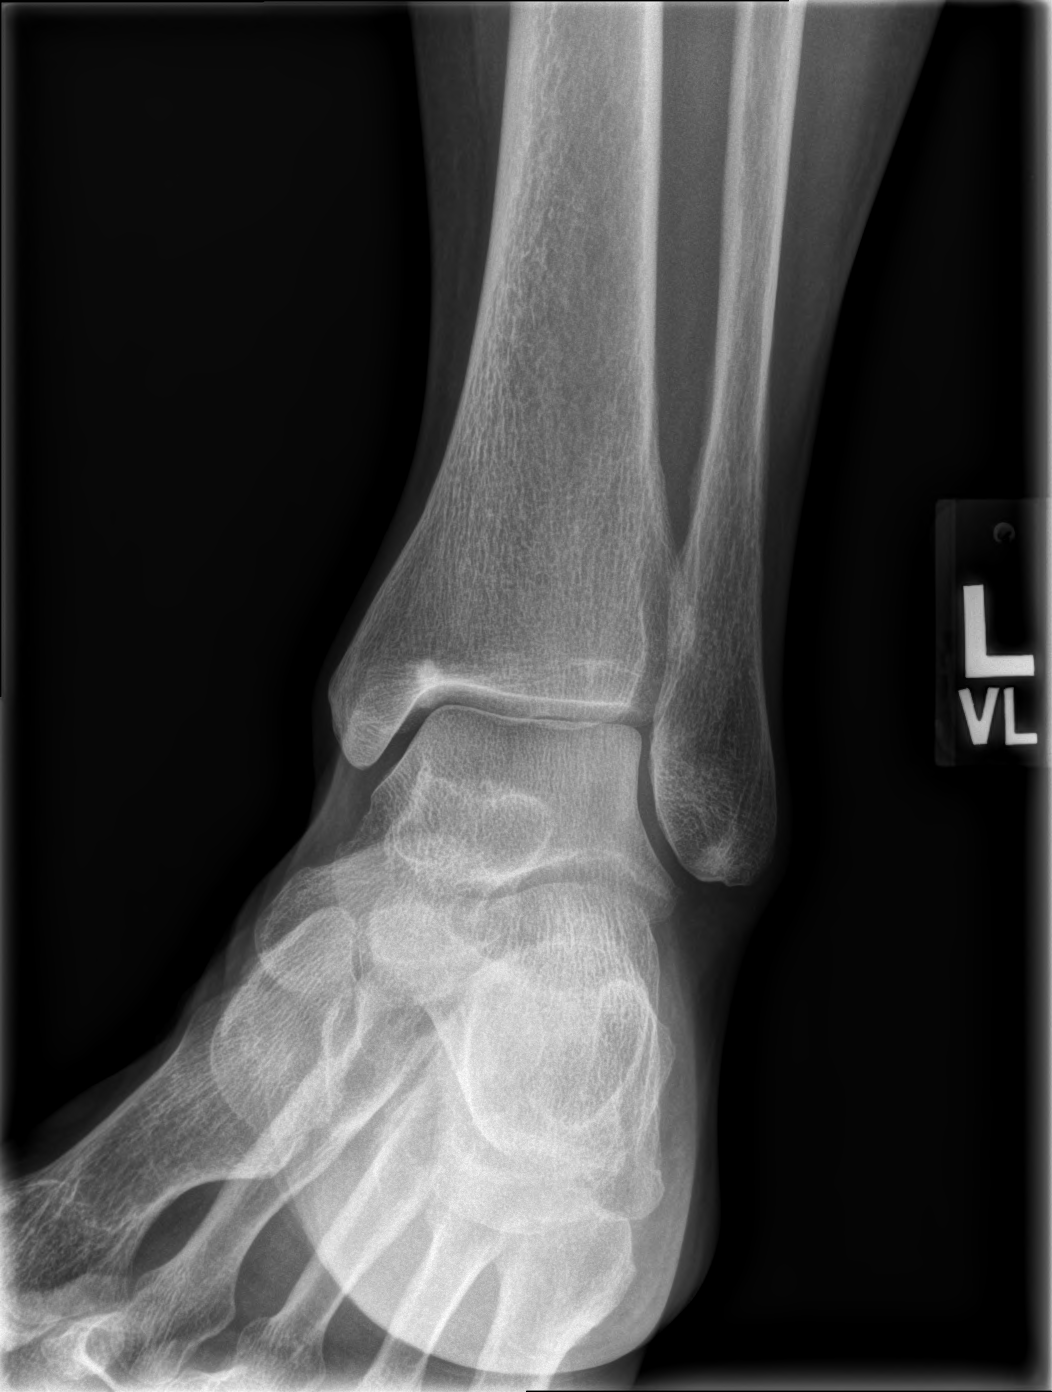
[im 3/3]
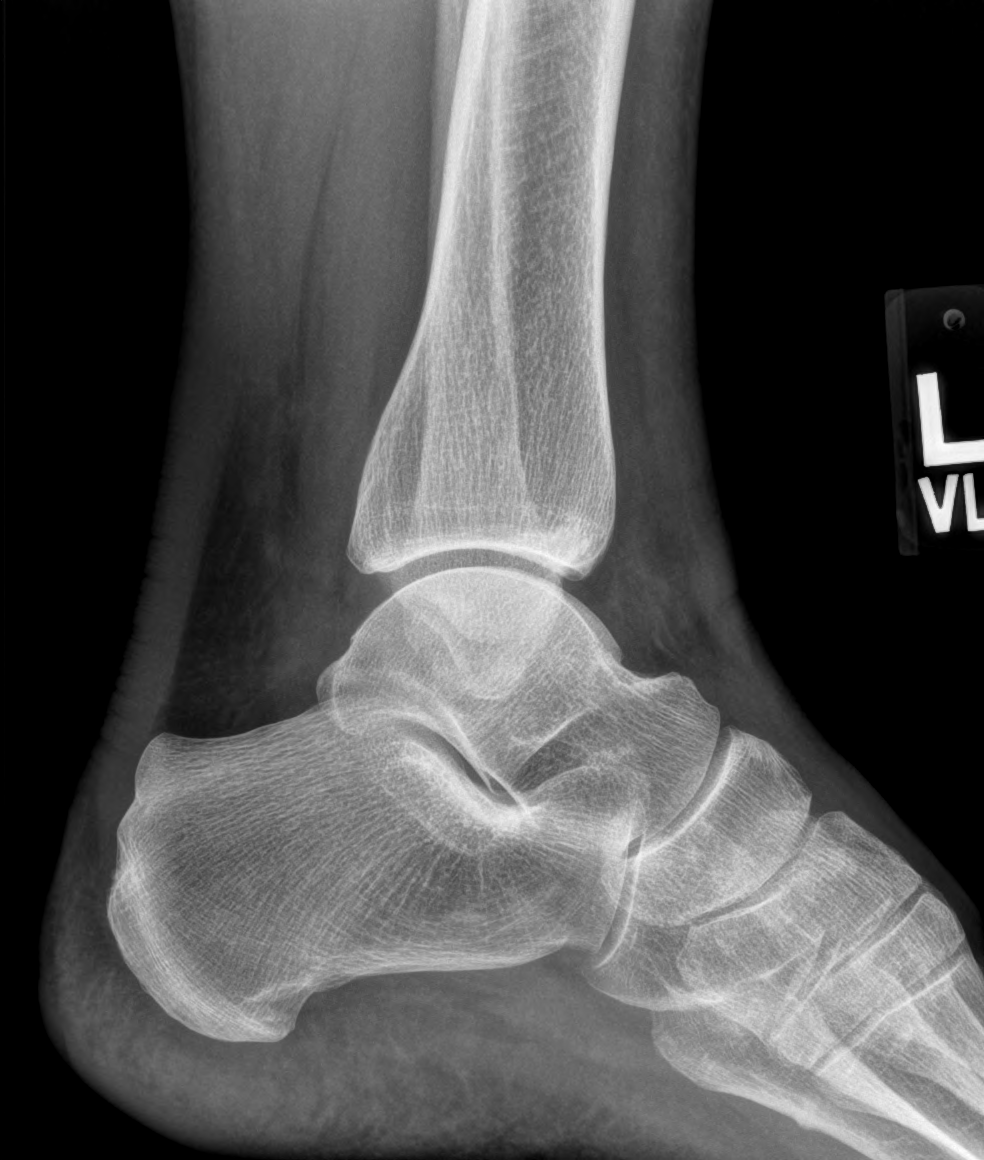

[3 of 3 positions shown; findings below may reference images not displayed]

FINDINGS: There is no soft tissue swelling, fracture or separation of the ankle mortise. The tibiotalar joint space is normal. The talus and os calcis are unremarkable. There is no joint effusion.
IMPRESSION: Normal left ankle.

## 2021-05-25 ENCOUNTER — Ambulatory Visit (INDEPENDENT_AMBULATORY_CARE_PROVIDER_SITE_OTHER): Payer: Medicare Other | Admitting: Internal Medicine

## 2021-05-26 IMAGING — MR MRI HINDFOOT LT WO CONTRAST
4 of 6 series · 22 of 40 positions shown · non-contrast
Comparison: Left ankle radiograph, 05/19/2021

INDICATION: Pain in left foot
TECHNIQUE: Multiplanar, multisequence imaging of the left hindfoot was performed without contrast.

[Series 4: t1_sag · sagittal · left · 3.0mm · 0.36mm/px · 4 of 18 slices shown]
[im 1/18]
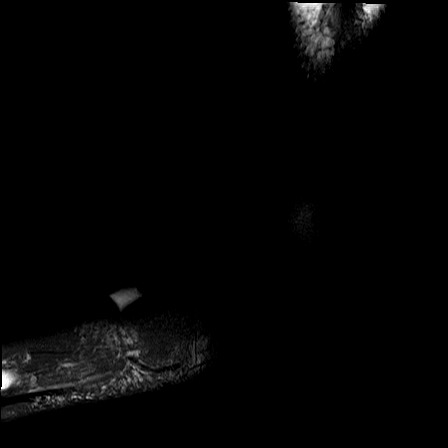
[im 6/18]
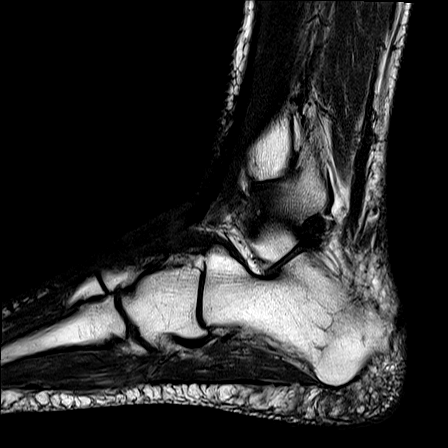
[im 12/18]
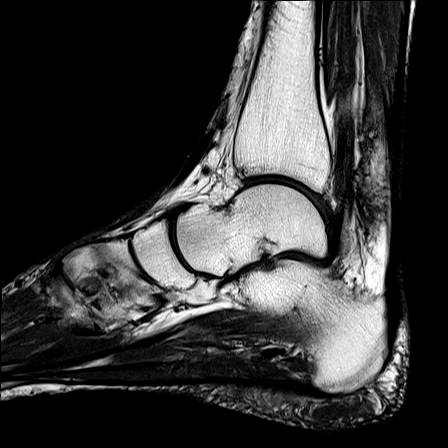
[im 18/18]
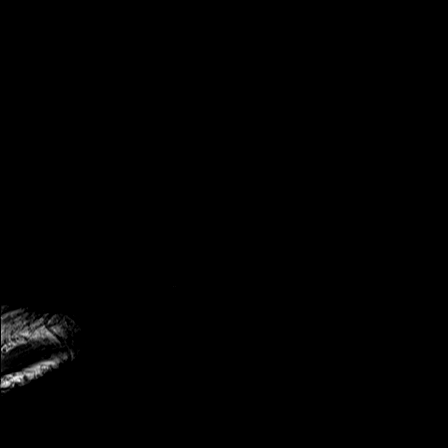

[Series 5: t2_sag_fs · sagittal · left · 3.0mm · 0.31mm/px · 4 of 18 slices shown]
[im 1/18]
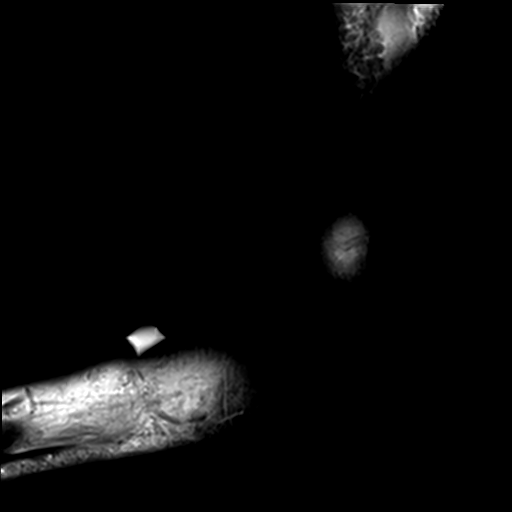
[im 6/18]
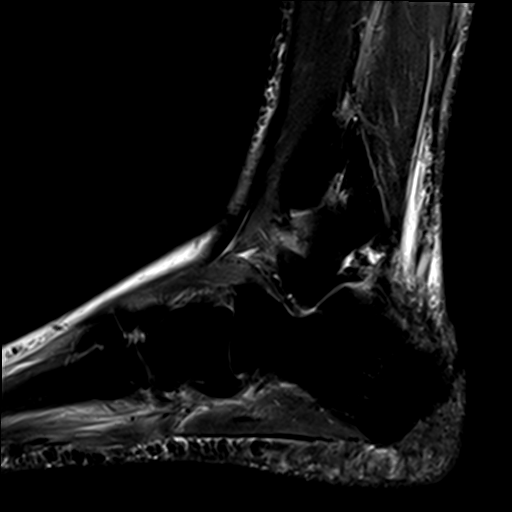
[im 12/18]
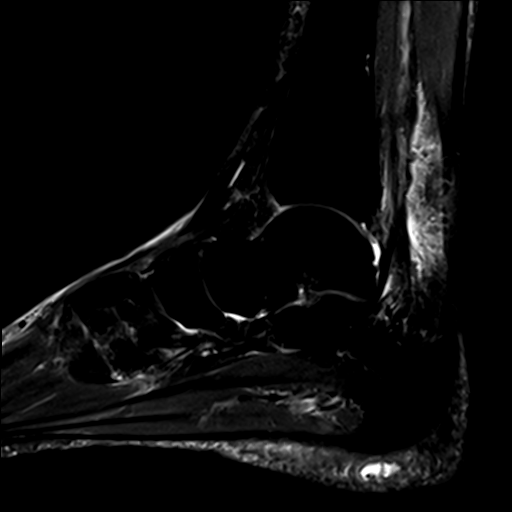
[im 18/18]
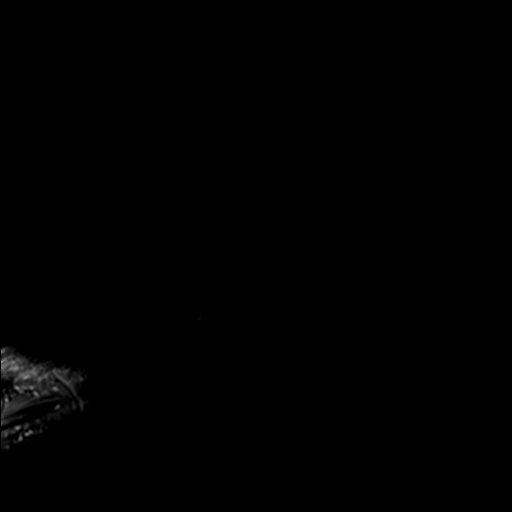

[Series 6: t1_axial · axial · left · 3.0mm · 0.33mm/px · z∈[-87,+28]mm · 7 of 30 slices shown]
[im 1/30]
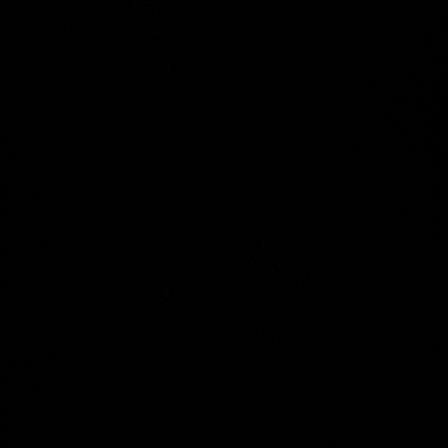
[im 5/30]
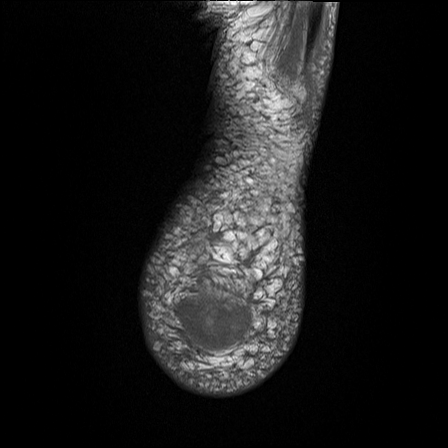
[im 10/30]
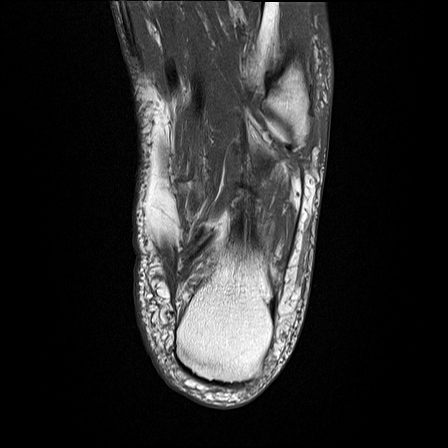
[im 15/30]
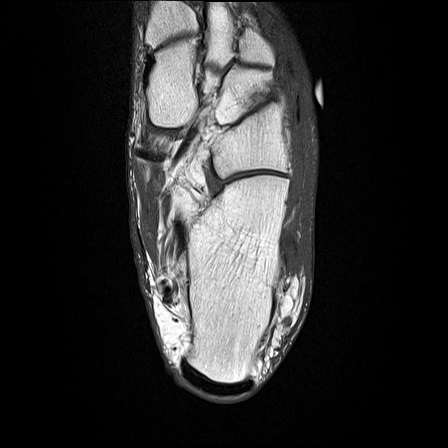
[im 20/30]
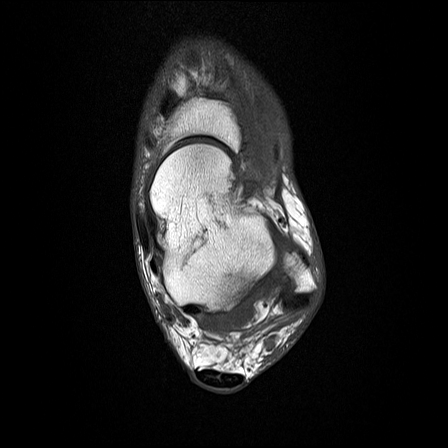
[im 25/30]
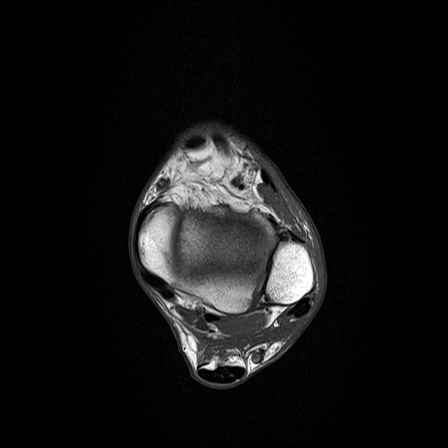
[im 30/30]
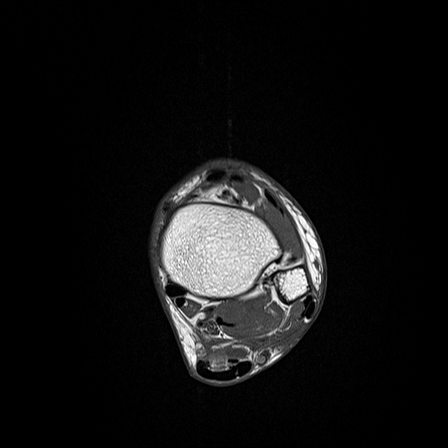

[Series 7: t2_axial_fs · axial · left · 3.0mm · 0.29mm/px · z∈[-87,+28]mm · 7 of 30 slices shown]
[im 1/30]
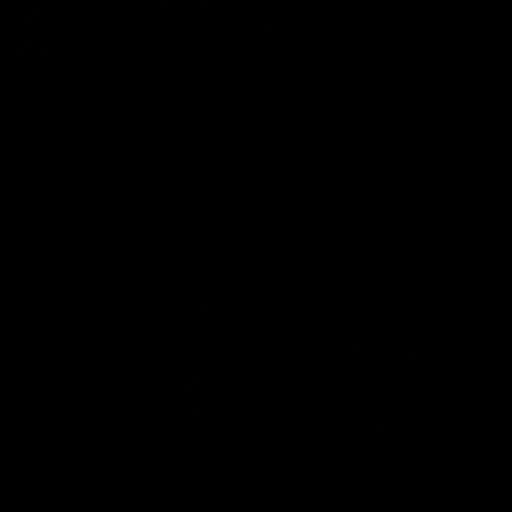
[im 5/30]
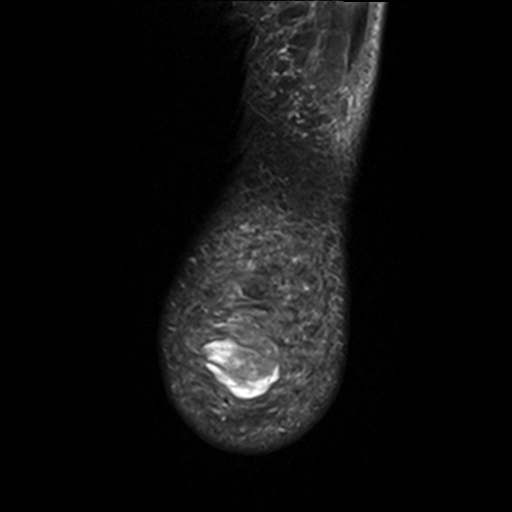
[im 10/30]
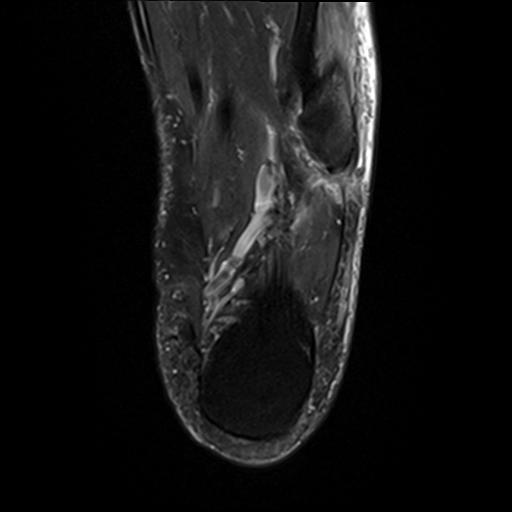
[im 15/30]
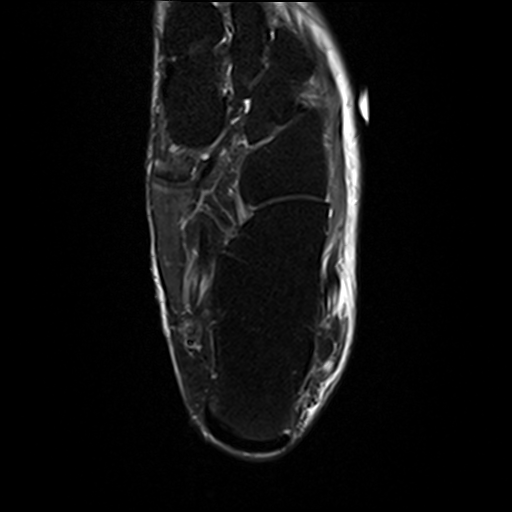
[im 20/30]
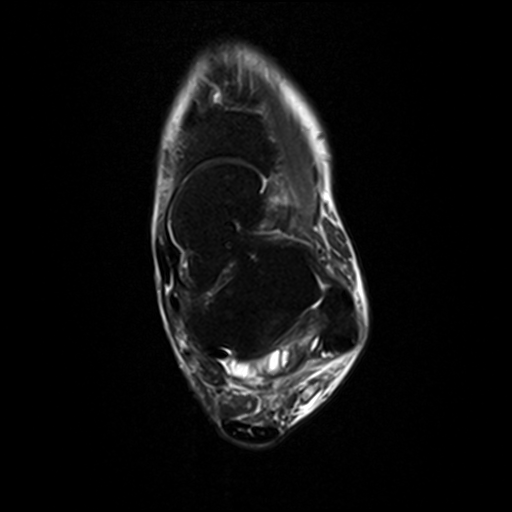
[im 25/30]
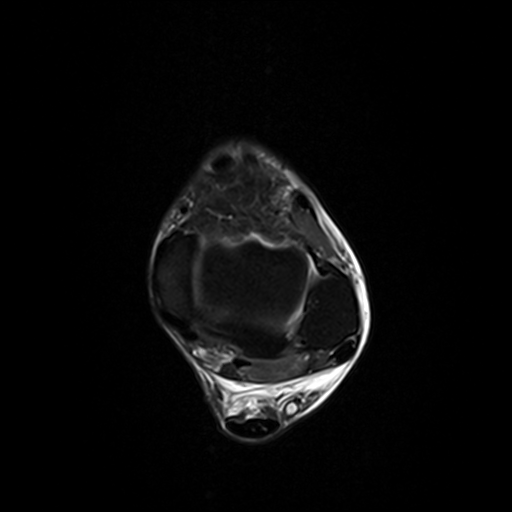
[im 30/30]
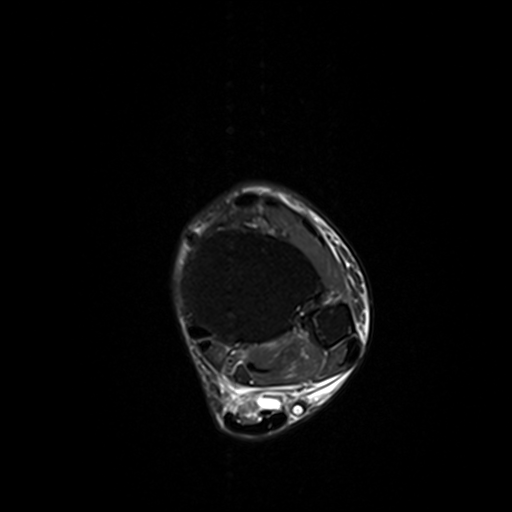

[22 of 40 positions shown; findings below may reference images not displayed]

FINDINGS: Osseous: A transverse, nondisplaced fracture of the proximal diaphysis of the fifth metatarsal. Marked associated bone marrow edema within the fifth metatarsal and associated periosteal edema. No additional areas of bone marrow edema.

Tibiotalar joint: No talar dome osteochondral lesion. No tibiotalar joint effusion.

Ligaments: Anterior talofibular ligament, calcaneofibular ligament and posterior talofibular ligaments are intact. Deep fibers of the deltoid ligament and spring ligament are intact. The anterior and posterior syndesmotic ligaments are intact.

Tendons: Mild peroneus brevis and longus tendinosis. No tendon tear. The anterior ankle tendons are intact. The tibialis posterior, flexor digitorum longus and flexor hallucis longus tendons are intact. Mild distal Achilles tendinosis.

Plantar aponeurosis: Mild thickening of the medial wall of the plantar aponeurosis without significant central or soft tissue edema. Small plantar catheter enthesophyte.

Other: The sinus tarsi fat is maintained. The tarsal tunnel is intact. Moderate subcutaneous soft tissue edema is present about the dorsolateral aspect of the forefoot. A 20 x 20 x 6 mm fluid collection is present within the plantar soft tissues of the foot overlying the calcaneal tuberosity, likely adventitial bursitis.
IMPRESSION: 1.
Transverse, nondisplaced fracture of the proximal diaphysis of the fifth metatarsal.

2.
Mild peroneus brevis and longus tendinosis.

3.
Mild distal Achilles tendinosis.

4.
Small fluid collection within the plantar soft tissues overlying the calcaneal tuberosity, likely adventitial bursitis.

## 2021-05-26 IMAGING — MR MRI FOREFOOT LT WO CONTRAST
6 of 7 series · 35 of 40 positions shown · non-contrast
Comparison: None available.

INDICATION: Pain in left foot
TECHNIQUE: Multiplanar, multisequence MR imaging of the left forefoot was performed without contrast.

[Series 3: t1_sag · sagittal · left · 3.0mm · 0.37mm/px · 6 of 28 slices shown]
[im 1/28]
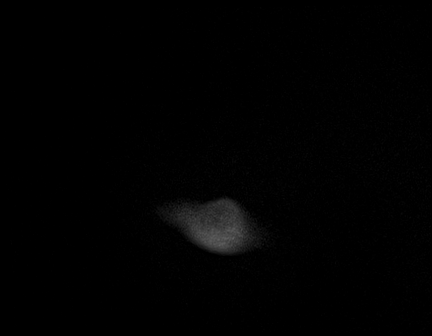
[im 6/28]
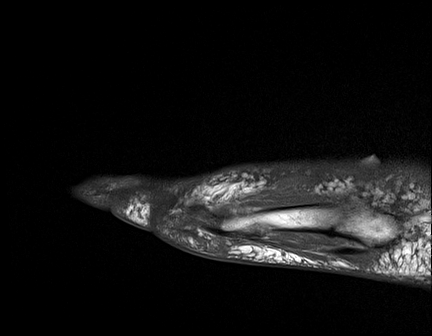
[im 11/28]
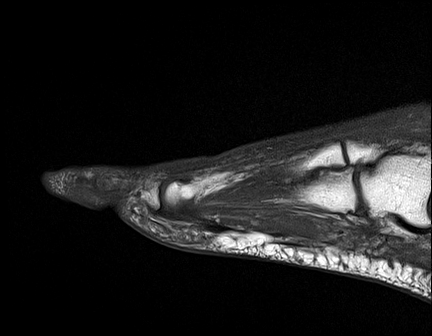
[im 17/28]
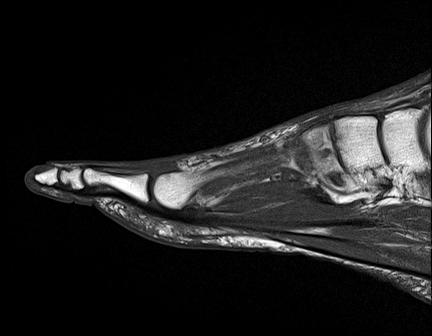
[im 22/28]
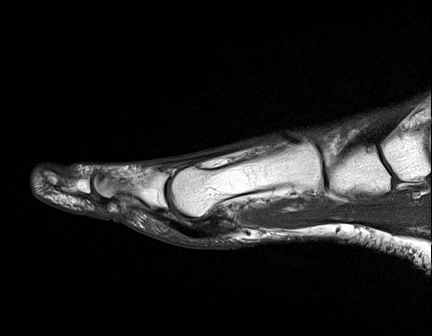
[im 28/28]
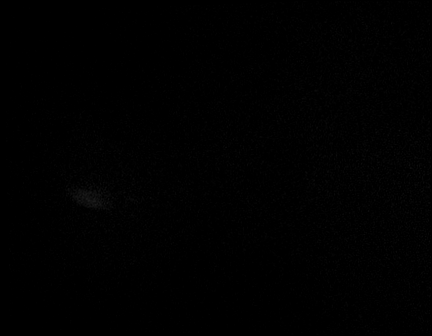

[Series 4: t2_sag_fs · sagittal · left · 3.0mm · 0.50mm/px · 5 of 28 slices shown]
[im 1/28]
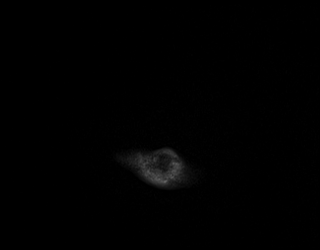
[im 7/28]
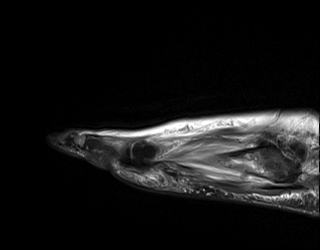
[im 14/28]
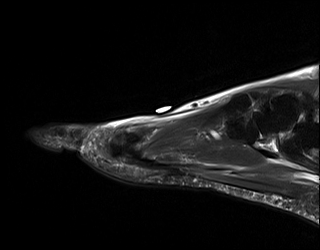
[im 21/28]
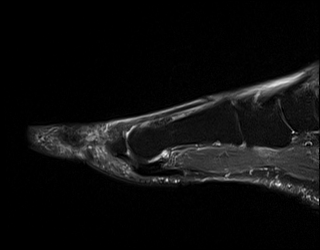
[im 28/28]
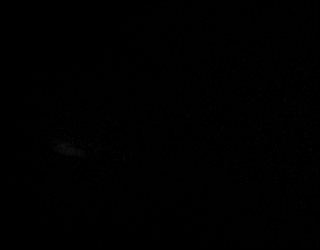

[Series 5: t1_axial · axial · left · 3.0mm · 0.36mm/px · z∈[-90,-31]mm · 4 of 20 slices shown]
[im 1/20]
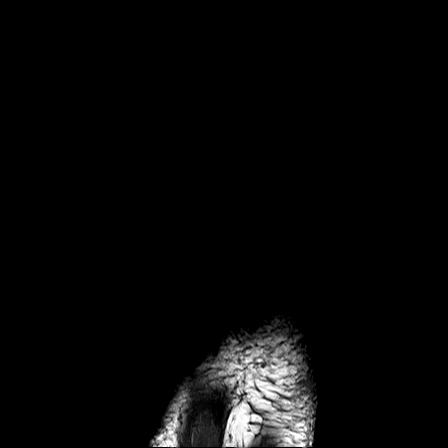
[im 7/20]
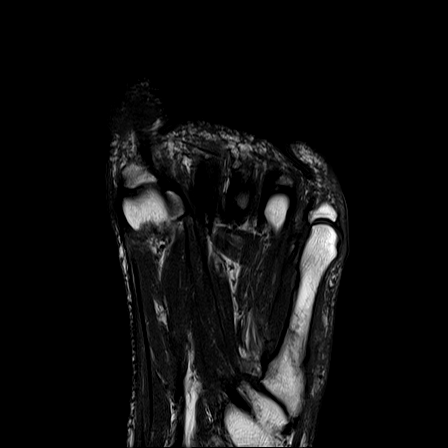
[im 13/20]
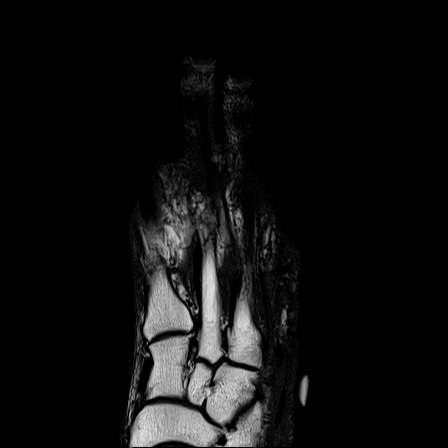
[im 20/20]
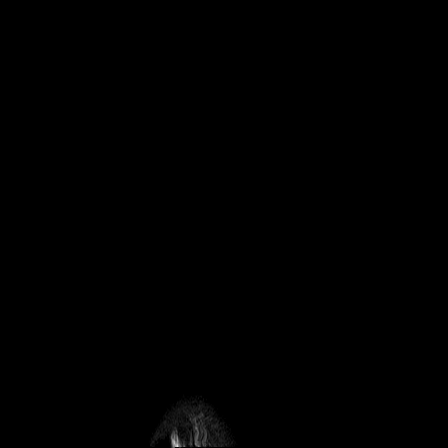

[Series 6: t2_axial_fs · axial · left · 3.0mm · 0.26mm/px · z∈[-90,-31]mm · 4 of 20 slices shown]
[im 1/20]
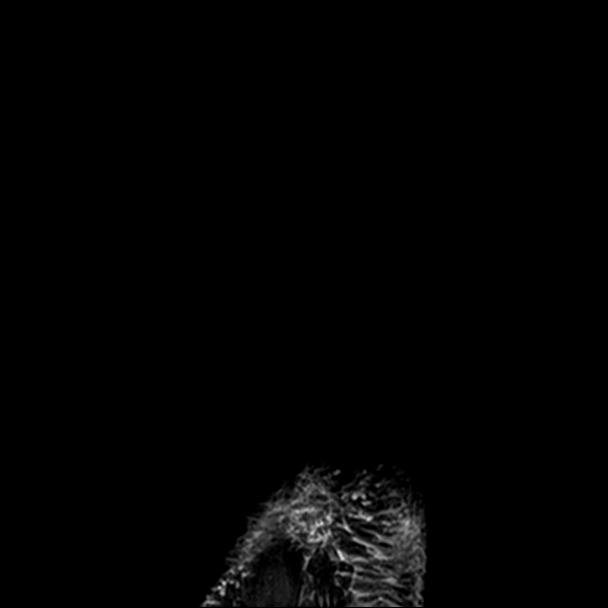
[im 7/20]
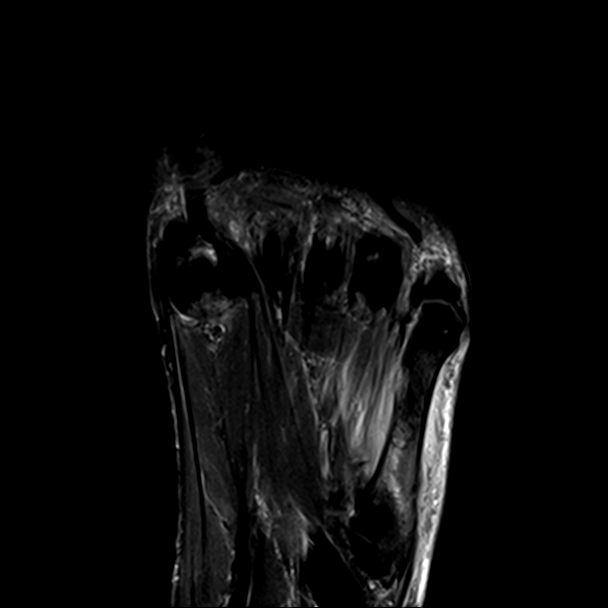
[im 13/20]
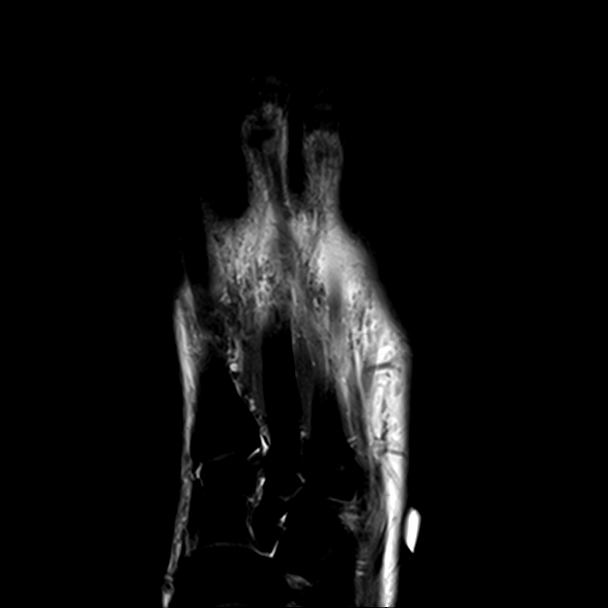
[im 20/20]
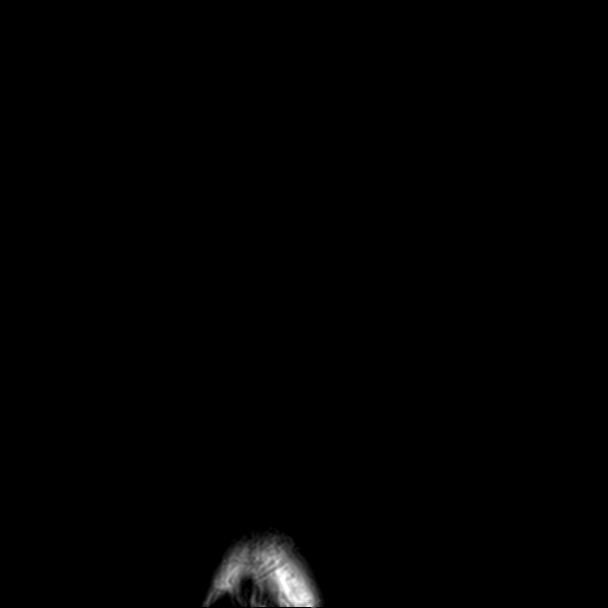

[Series 7: t1_cor · coronal · left · 3.0mm · 0.36mm/px · 8 of 42 slices shown]
[im 1/42]
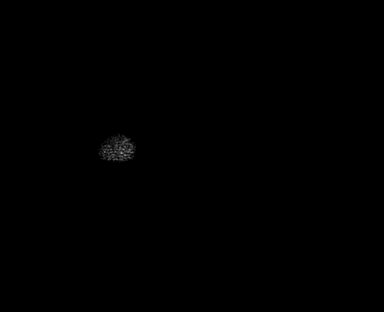
[im 6/42]
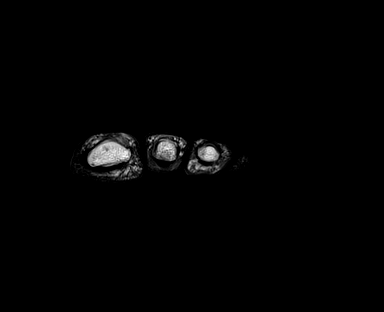
[im 12/42]
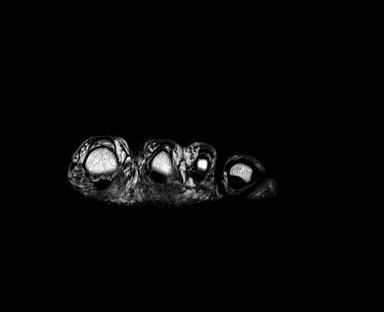
[im 18/42]
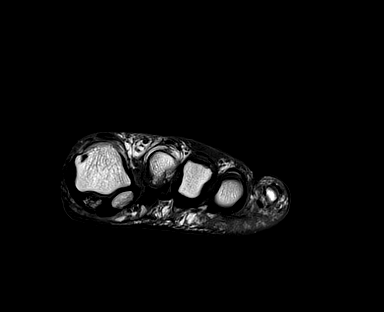
[im 24/42]
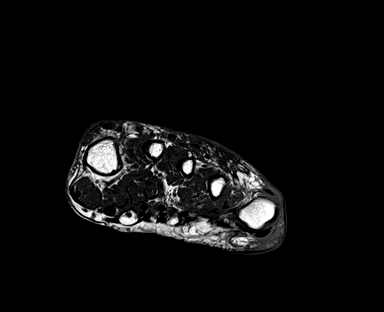
[im 30/42]
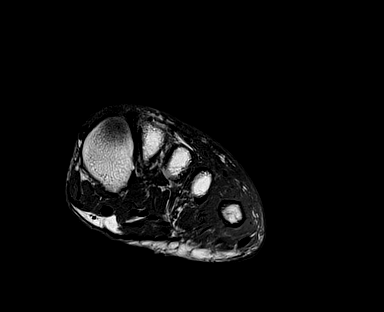
[im 36/42]
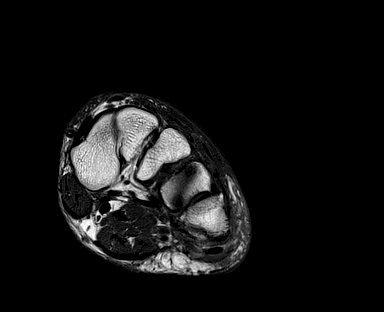
[im 42/42]
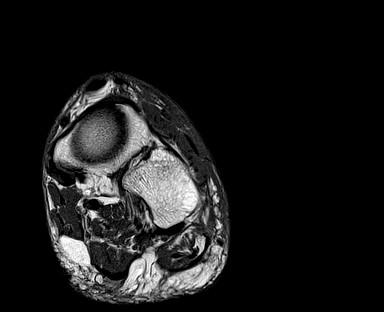

[Series 8: ir_cor · coronal · left · 3.0mm · 0.42mm/px · 8 of 45 slices shown]
[im 1/45]
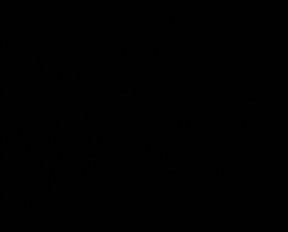
[im 6/45]
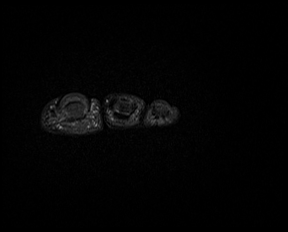
[im 12/45]
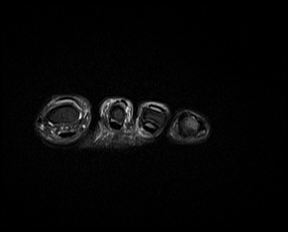
[im 17/45]
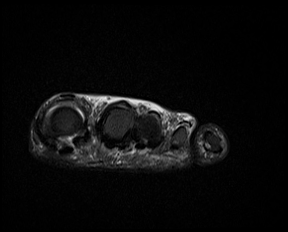
[im 28/45]
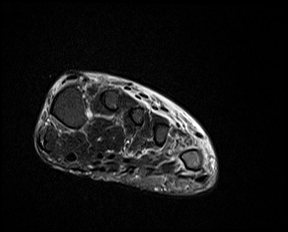
[im 34/45]
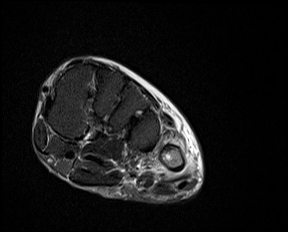
[im 39/45]
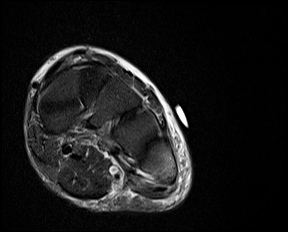
[im 45/45]
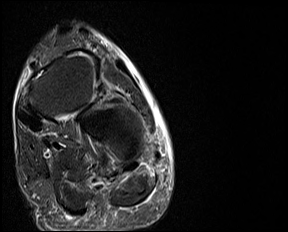

[35 of 40 positions shown; findings below may reference images not displayed]

FINDINGS: Osseous: Transverse, nondisplaced fracture of the proximal diaphysis of the fifth metatarsal is present with marked associated bone marrow edema and associated periosteal edema. No additional bone marrow edema. Alignment of the midfoot is maintained. No aggressive or destructive osseous lesion. Mild first MTP joint osteoarthritis. Mild bone marrow edema within the medial hallux sesamoid.

Lisfranc ligament: Intact.

Musculotendinous: The flexor and extensor tendons are intact. No tenosynovitis. Edema is present within the intrinsic foot musculature adjacent to the fifth metatarsal.

Other: Edema is present within the plantar soft tissues overlying the head of the fifth metatarsal. No focal perineural fibrosis. No significant intermetatarsal bursal fluid.
IMPRESSION: Transverse, nondisplaced fracture of the proximal diaphysis of the fifth metatarsal.

## 2021-06-15 ENCOUNTER — Encounter (INDEPENDENT_AMBULATORY_CARE_PROVIDER_SITE_OTHER): Payer: Self-pay

## 2021-06-16 ENCOUNTER — Encounter (INDEPENDENT_AMBULATORY_CARE_PROVIDER_SITE_OTHER): Payer: Self-pay

## 2021-06-18 ENCOUNTER — Other Ambulatory Visit (INDEPENDENT_AMBULATORY_CARE_PROVIDER_SITE_OTHER): Payer: Self-pay | Admitting: Internal Medicine

## 2021-06-18 DIAGNOSIS — I10 Essential (primary) hypertension: Secondary | ICD-10-CM

## 2021-07-16 ENCOUNTER — Encounter (INDEPENDENT_AMBULATORY_CARE_PROVIDER_SITE_OTHER): Payer: Self-pay

## 2021-07-17 ENCOUNTER — Encounter (INDEPENDENT_AMBULATORY_CARE_PROVIDER_SITE_OTHER): Payer: Self-pay

## 2021-08-16 ENCOUNTER — Encounter (INDEPENDENT_AMBULATORY_CARE_PROVIDER_SITE_OTHER): Payer: Self-pay

## 2021-08-17 ENCOUNTER — Encounter (INDEPENDENT_AMBULATORY_CARE_PROVIDER_SITE_OTHER): Payer: Self-pay

## 2021-08-29 ENCOUNTER — Other Ambulatory Visit (INDEPENDENT_AMBULATORY_CARE_PROVIDER_SITE_OTHER): Payer: Self-pay | Admitting: Internal Medicine

## 2021-08-29 DIAGNOSIS — F99 Mental disorder, not otherwise specified: Secondary | ICD-10-CM

## 2021-08-29 DIAGNOSIS — F331 Major depressive disorder, recurrent, moderate: Secondary | ICD-10-CM

## 2021-09-15 ENCOUNTER — Encounter (INDEPENDENT_AMBULATORY_CARE_PROVIDER_SITE_OTHER): Payer: Self-pay

## 2021-09-16 ENCOUNTER — Encounter (INDEPENDENT_AMBULATORY_CARE_PROVIDER_SITE_OTHER): Payer: Self-pay

## 2021-10-16 ENCOUNTER — Encounter (INDEPENDENT_AMBULATORY_CARE_PROVIDER_SITE_OTHER): Payer: Self-pay

## 2021-10-17 ENCOUNTER — Encounter (INDEPENDENT_AMBULATORY_CARE_PROVIDER_SITE_OTHER): Payer: Self-pay

## 2021-11-23 IMAGING — CT CT ABD/PEL WO CONT
2 of 4 series · 13 of 46 positions shown, 15 images · IV contrast (agent unspecified)
Comparison: None

HISTORY: 70-year-old female with epigastric abdominal pain.
TECHNIQUE: Axial CT images of the abdomen and pelvis are obtained from the hemidiaphragms to the pubic symphysis without intravenous contrast. Coronal and sagittal images were reformatted. Evaluation of the solid and hollow visceral organs is limited due to lack of IV contrast. Dose reduction technique used: Automated exposure control and adjustment of the mA and/or kV according to patient size. CT count in previous 12-months: 0

Oral contrast: 900 ml Readi-Cat.
Total radiation dose to patient is CTDIvol 6.65 mGy and DLP 297.00 mGy-cm.

[Series 2: soft tissue · axial · 0.50mm/px · z∈[+1197,+1532]mm · 10 of 83 slices shown, 12 images]
[im 8/83  soft-tissue]
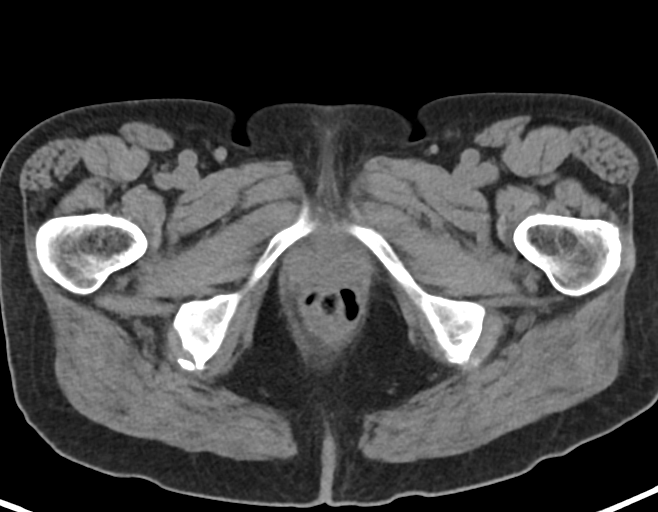
[im 8/83  bone]
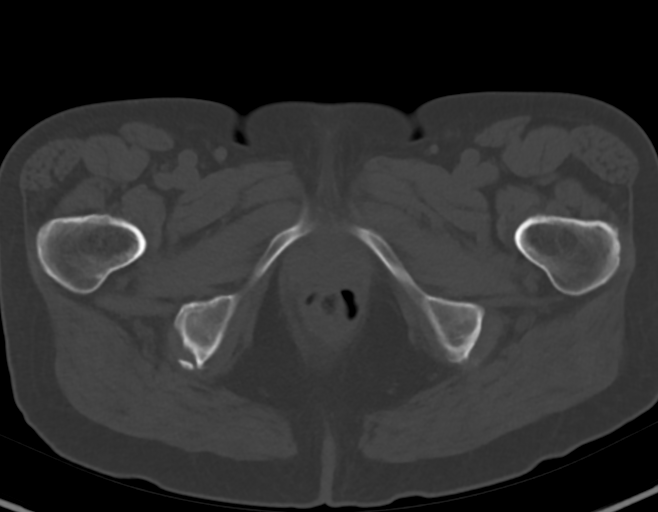
[im 15/83  soft-tissue]
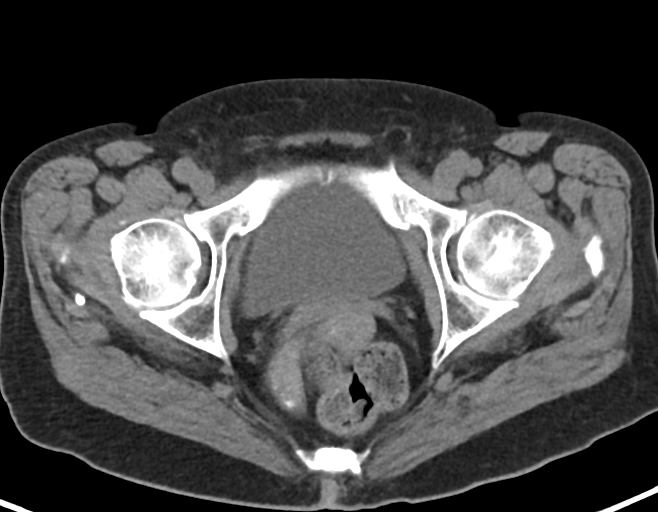
[im 23/83  soft-tissue]
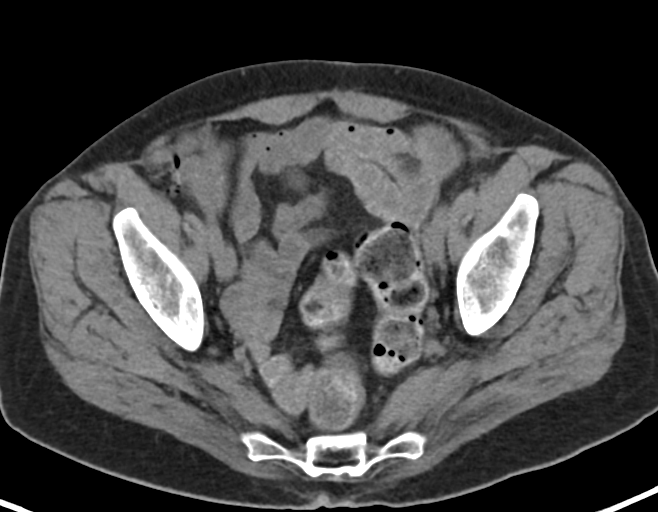
[im 30/83  soft-tissue]
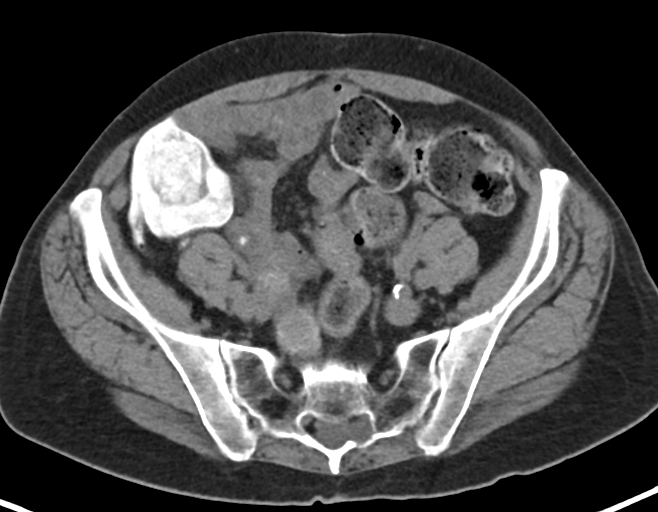
[im 38/83  soft-tissue]
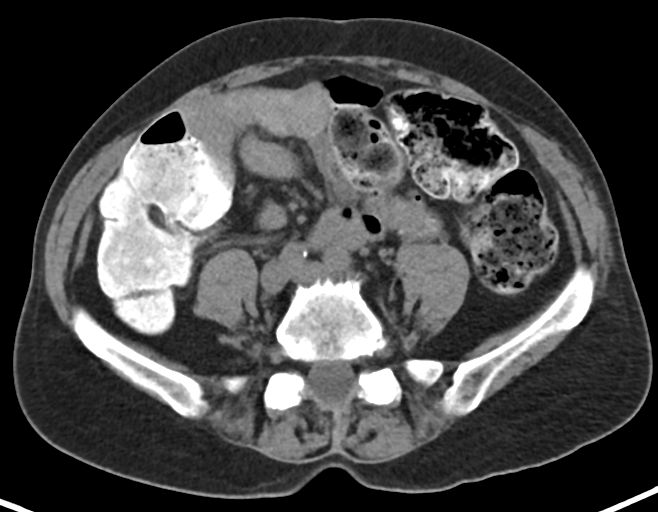
[im 45/83  soft-tissue]
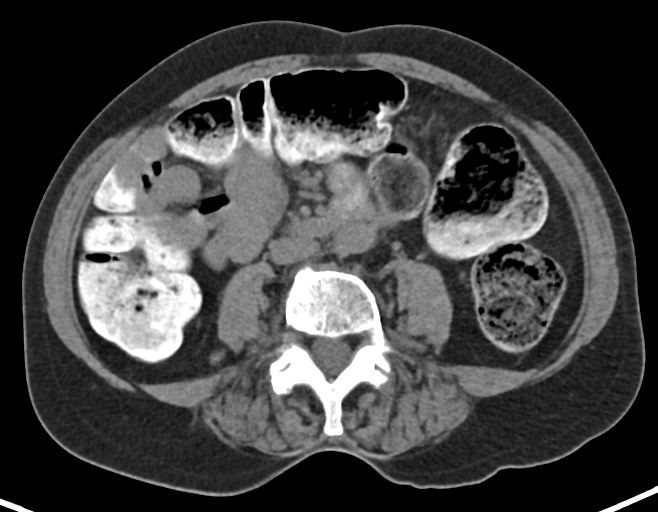
[im 53/83  soft-tissue]
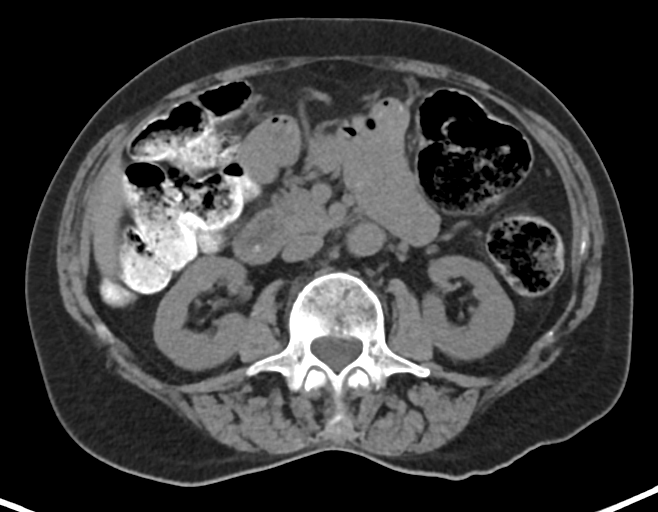
[im 60/83  soft-tissue]
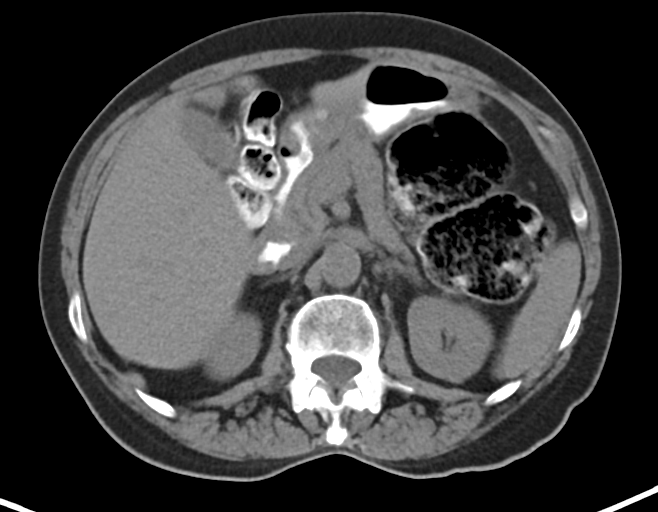
[im 68/83  soft-tissue]
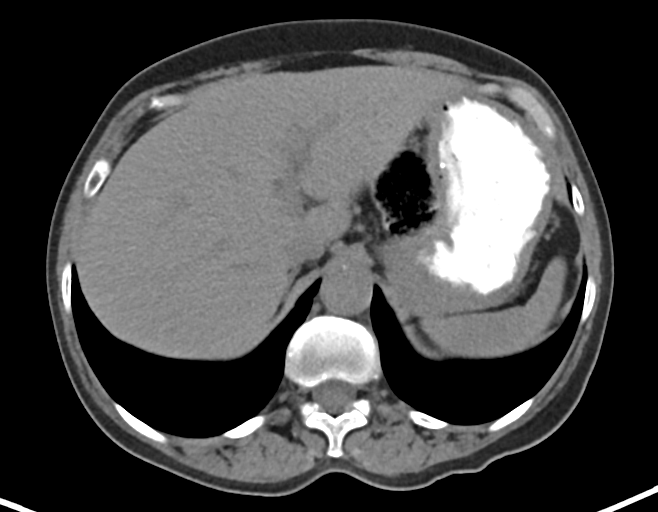
[im 68/83  bone]
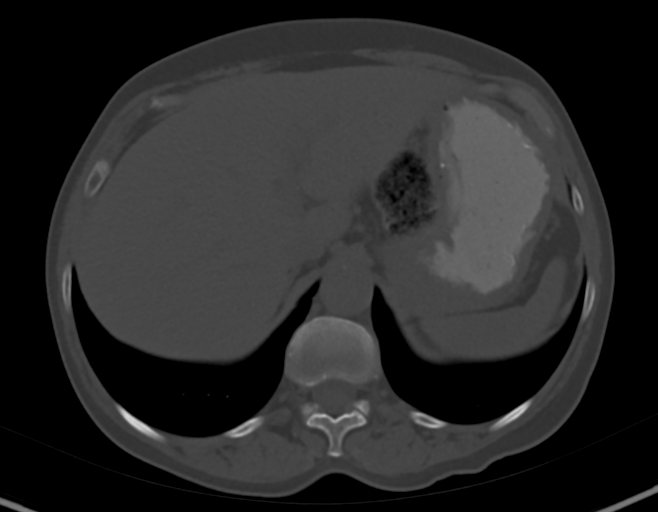
[im 75/83  soft-tissue]
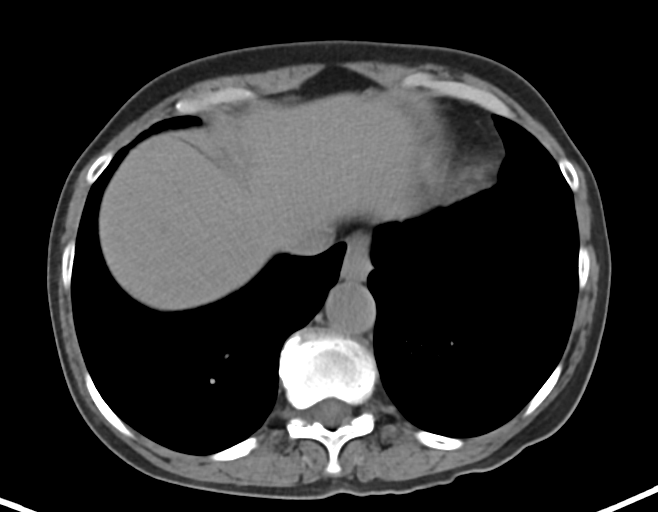

[Series 6: coronal · coronal · 0.64mm/px · 3 of 45 slices shown]
[im 15/45  soft-tissue]
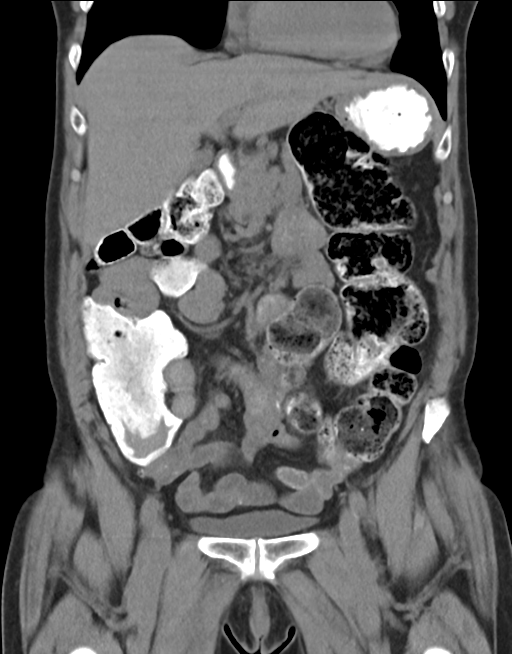
[im 20/45  soft-tissue]
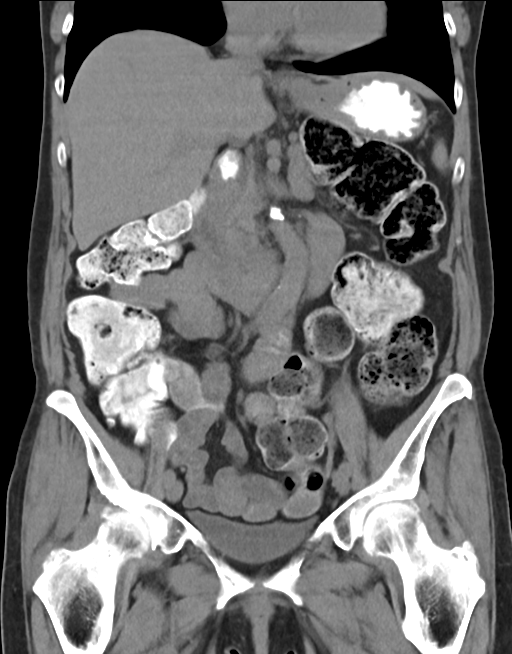
[im 25/45  soft-tissue]
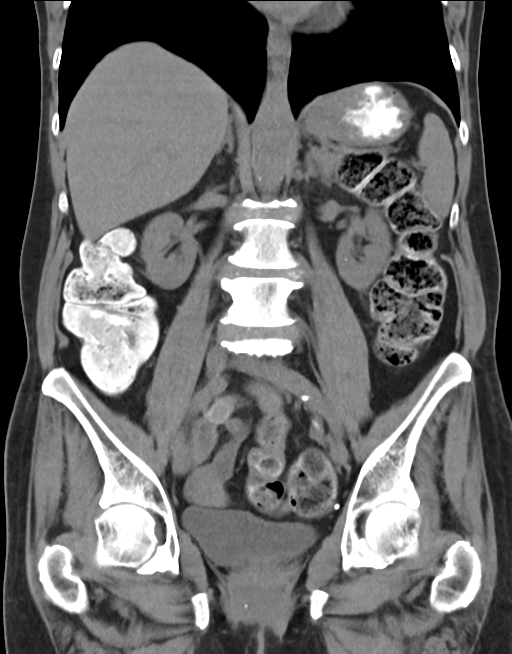

[13 of 46 positions shown; findings below may reference images not displayed]

FINDINGS: Moderate to large stool throughout the colon and rectum.

Visualized lung bases: Visualized portions of the lung bases are clear.

Hepatobiliary system: The liver is unremarkable. No intra- or extrahepatic biliary ductal dilatation. 

Gallbladder: The gallbladder is fluid-filled without radiopaque stones, wall thickening or pericholecystic fluid.

Spleen: No splenomegaly or focal abnormalities.

Stomach: The stomach is unremarkable.

Pancreas: No focal masses. No pancreatic ductal dilatation. No significant peripancreatic inflammatory changes.

Adrenal glands: No focal masses.

Kidneys and collecting system: No hydronephrosis or nephrolithiasis. No ureterolithiasis. Visualized urinary bladder grossly within normal limits.

GI tract and bowel: Normal in caliber without wall thickening or dilatation. There is no CT evidence appendicitis.

Retrocrural or para-aortic lymphadenopathy: None.

Pelvis: The visualized intrapelvic organs are within normal limits.

Visualized osseous structures: No lytic or blastic lesions. No acute fractures or subluxation. Degenerative disc changes: No significant degenerative changes are seen.

Abdominal wall: The abdominal wall is intact.
IMPRESSION: Stool retention. Otherwise unremarkable noncontrast study.

## 2022-01-29 ENCOUNTER — Other Ambulatory Visit: Payer: Self-pay

## 2022-01-29 NOTE — Progress Notes (Signed)
Nurse Navigator placed telephone call to patient to verify patient's primary care physician and discuss possible care management needs with patient. No answer at contact telephone number, left voicemail message to call back.      Omarii Scalzo, RN  Nurse Navigator  [facility]  Ambulatory Care Management Team   [phone]

## 2022-02-20 ENCOUNTER — Other Ambulatory Visit (INDEPENDENT_AMBULATORY_CARE_PROVIDER_SITE_OTHER): Payer: Self-pay | Admitting: Internal Medicine

## 2022-02-20 ENCOUNTER — Encounter (INDEPENDENT_AMBULATORY_CARE_PROVIDER_SITE_OTHER): Payer: Self-pay

## 2022-02-20 DIAGNOSIS — F331 Major depressive disorder, recurrent, moderate: Secondary | ICD-10-CM

## 2022-02-20 NOTE — Telephone Encounter (Signed)
Needs to schedule an appointment before refills

## 2022-02-20 NOTE — Telephone Encounter (Signed)
Called patient but no answer.So LVM and sent my chart message to call us back to schedule the appointment for the medication refill with PCP.

## 2022-04-05 IMAGING — MG MAMMO SCRN BIL W/CAD TOMO
8 series · 8 of 24 positions shown · non-contrast
Comparison: The present examination has been compared to prior imaging studies.

Images Obtained from [HOSPITAL] Imaging
INDICATION: Screening.
TECHNIQUE: Bilateral 2-D digital screening mammogram was performed followed by 3-D tomosynthesis.  Current study was also evaluated with a computer aided detection (CAD) system.
MAMMOGRAM FINDINGS:
There are scattered areas of fibroglandular density.
No suspicious abnormality is seen in either breast.  There are no significant changes from the prior study.

[L MLO]
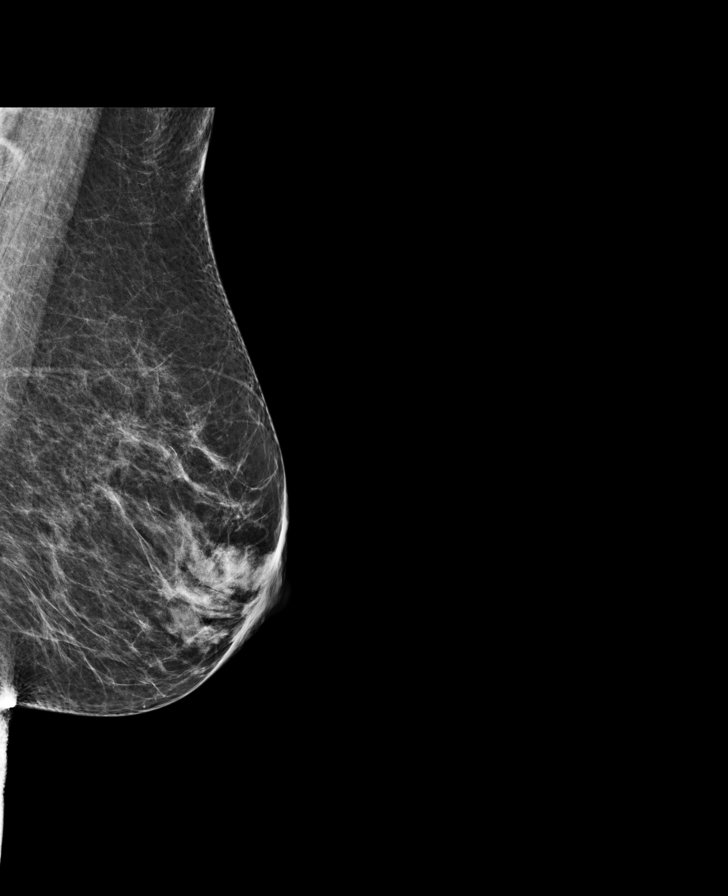

[R CC]
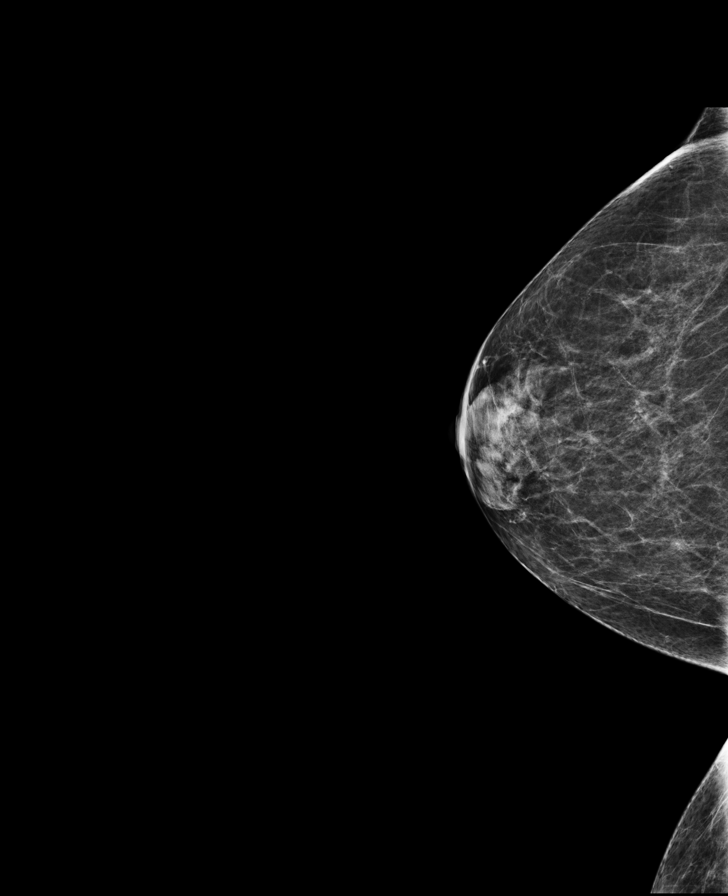

[R MLO]
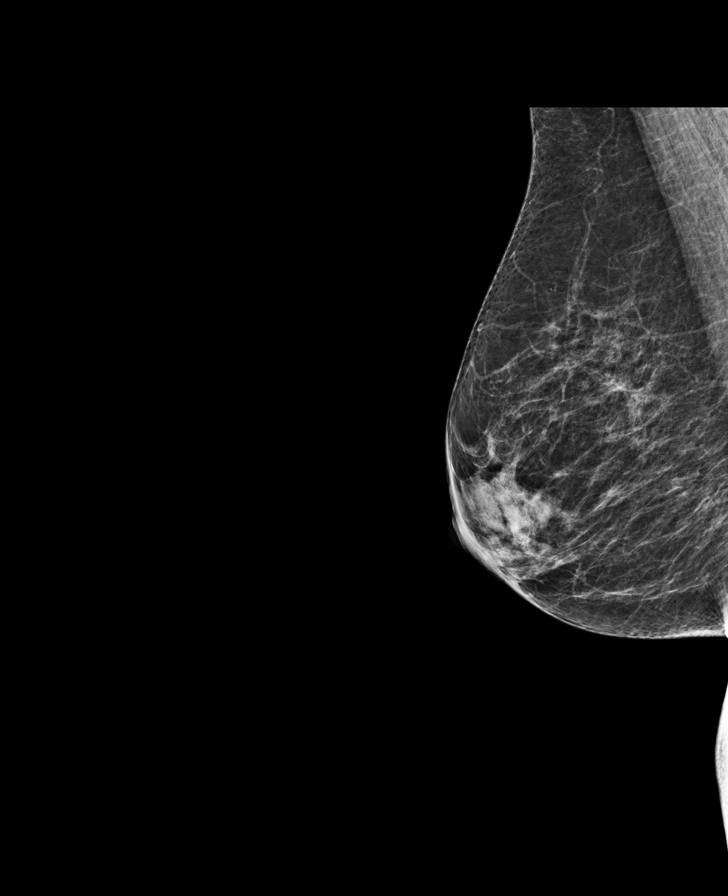

[L CC]
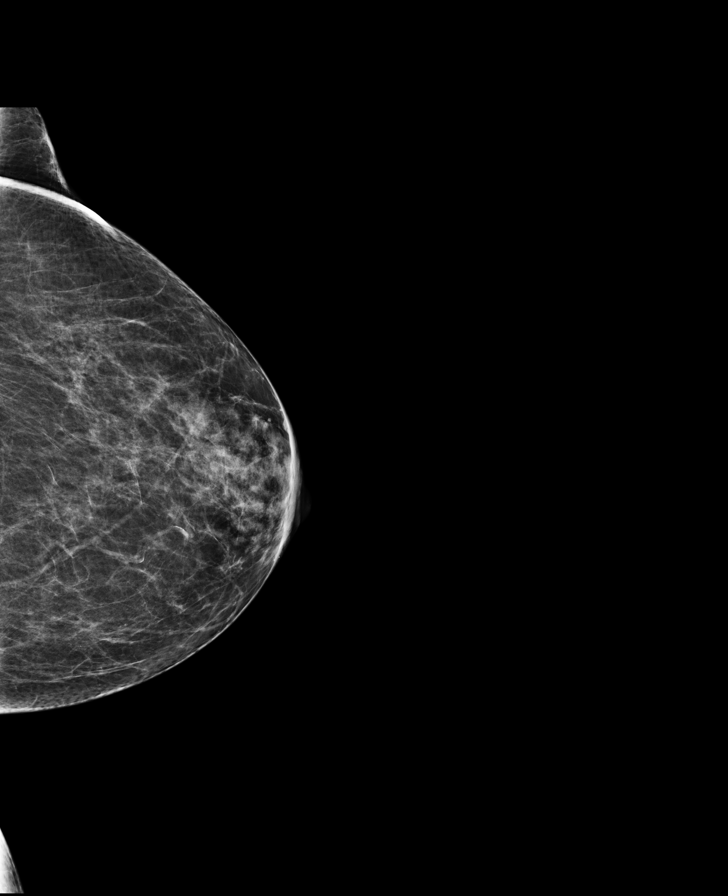

[R CC tomo · tomo slice 32/63.0]
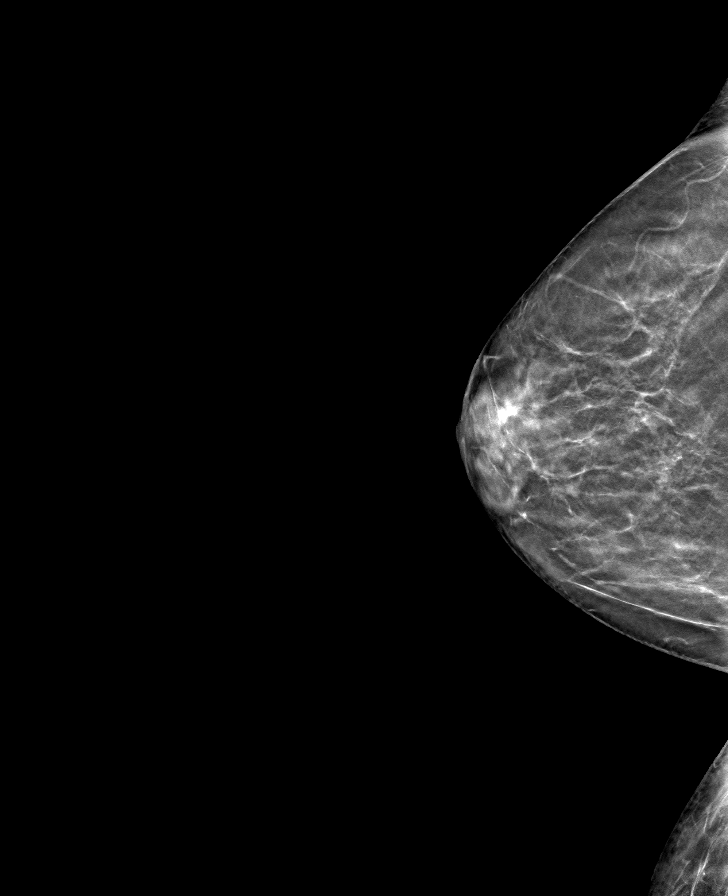

[L MLO tomo · tomo slice 33/65.0]
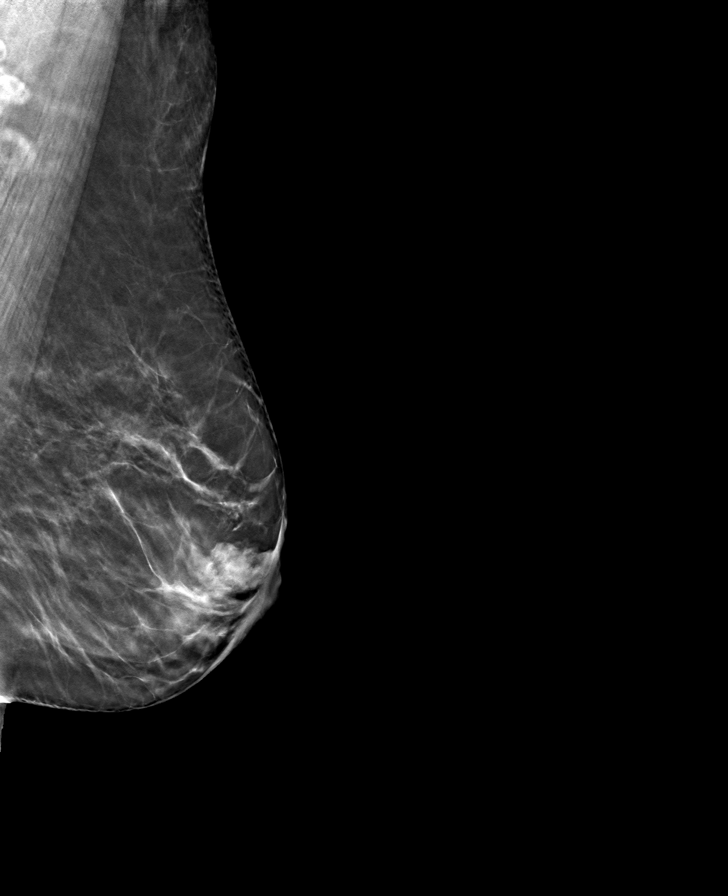

[R MLO tomo · tomo slice 30/59.0]
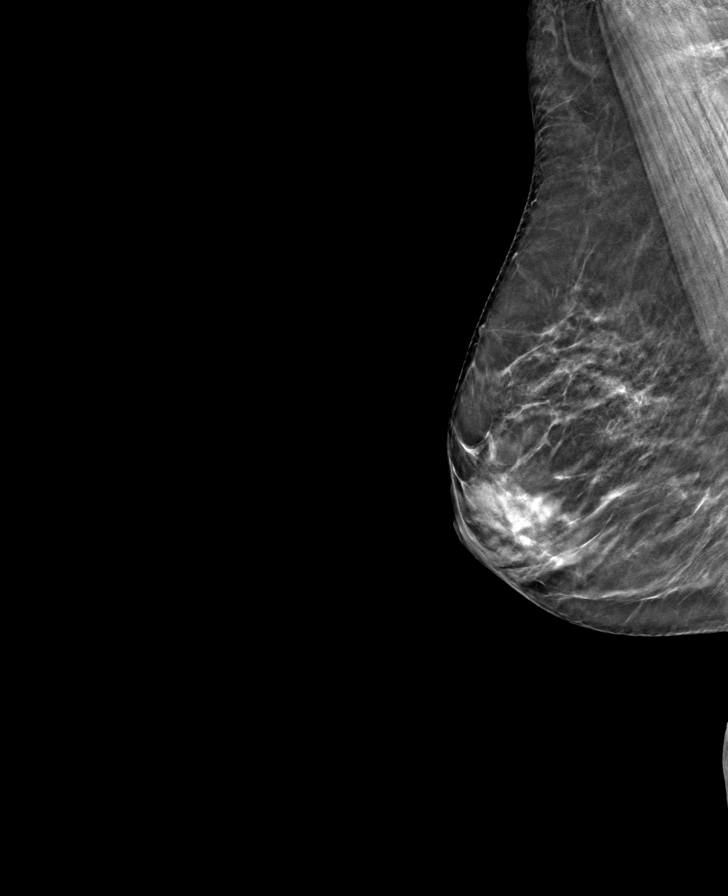

[L CC tomo · tomo slice 33/66.0]
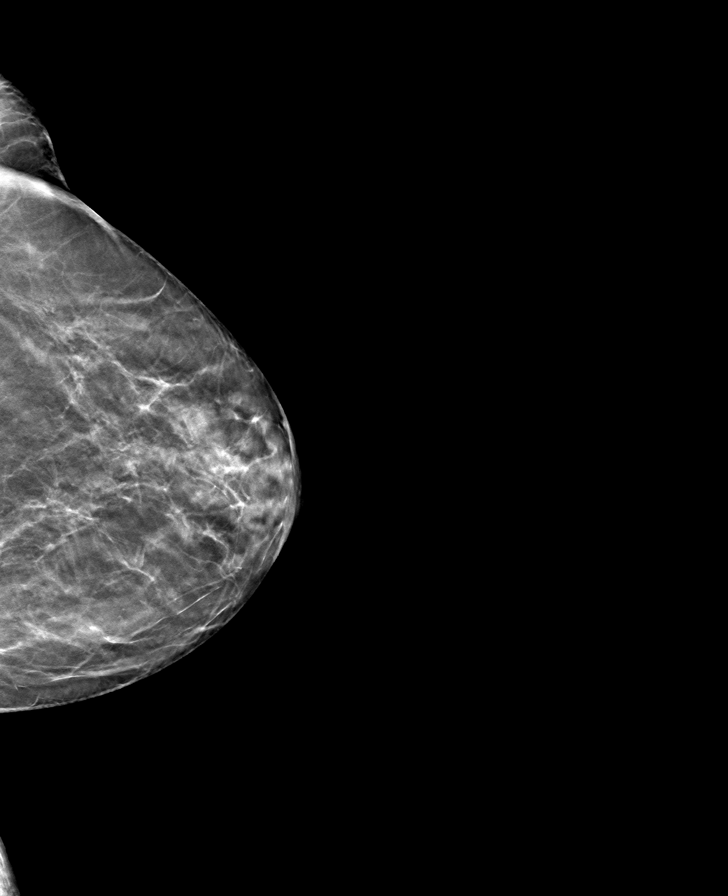

[8 of 24 positions shown; findings below may reference images not displayed]

IMPRESSION: There is no mammographic evidence of malignancy.
Screening mammogram recommended in 1 year.
BI-RADS Category 1: Negative

## 2024-09-09 ENCOUNTER — Other Ambulatory Visit (INDEPENDENT_AMBULATORY_CARE_PROVIDER_SITE_OTHER): Payer: Self-pay | Admitting: Internal Medicine
